# Patient Record
Sex: Female | Born: 1935 | Race: Black or African American | Hispanic: No | Marital: Married
Health system: Midwestern US, Community
[De-identification: ages and names within clinical notes are randomized; demographics above are authoritative.]

## PROBLEM LIST (undated history)

## (undated) DIAGNOSIS — IMO0001 Reserved for inherently not codable concepts without codable children: Secondary | ICD-10-CM

## (undated) DIAGNOSIS — I4891 Unspecified atrial fibrillation: Secondary | ICD-10-CM

## (undated) DIAGNOSIS — C541 Malignant neoplasm of endometrium: Secondary | ICD-10-CM

## (undated) DIAGNOSIS — M1990 Unspecified osteoarthritis, unspecified site: Secondary | ICD-10-CM

## (undated) DIAGNOSIS — IMO0002 Reserved for concepts with insufficient information to code with codable children: Secondary | ICD-10-CM

## (undated) DIAGNOSIS — E7800 Pure hypercholesterolemia, unspecified: Secondary | ICD-10-CM

## (undated) DIAGNOSIS — I639 Cerebral infarction, unspecified: Secondary | ICD-10-CM

## (undated) DIAGNOSIS — I10 Essential (primary) hypertension: Secondary | ICD-10-CM

## (undated) HISTORY — DX: Reserved for inherently not codable concepts without codable children: IMO0001

## (undated) HISTORY — DX: Reserved for concepts with insufficient information to code with codable children: IMO0002

## (undated) HISTORY — PX: DILATION AND CURETTAGE OF UTERUS: SHX78

---
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
## 2012-07-11 HISTORY — PX: COLONOSCOPY: SHX174

## 2014-07-16 HISTORY — PX: ROBOTIC ASSISTED TOTAL HYSTERECTOMY WITH BILATERAL SALPINGO OOPHERECTOMY: SHX6086

## 2014-07-16 LAB — BLOOD TYPE, (ABO+RH)
ABO/Rh(D): O POS
ABO/Rh: O POS

## 2014-07-16 LAB — BLOOD TYPE CONFIRM.: ABO/Rh(D): O POS

## 2014-07-16 LAB — ANTIBODY SCREEN: Antibody screen: NEGATIVE

## 2014-07-16 NOTE — Op Note (Signed)
[facility]  Operation [facility]  NAME:  Brittney Mcgrath, Brittney Mcgrath  SEX:   F  DATE: 07/16/2014  DOB: 09/03/1933  MR#    [id number]  ROOM:  [id number]  ACCT#  [id number]        DATE OF OPERATION:   07/16/2014    PREOPERATIVE DIAGNOSIS:  Complex hyperplasia with atypia.    POSTOPERATIVE DIAGNOSES:  Complex hyperplasia with atypia.    PRIMARY SURGEON:  Alona BeneStacey Elliemae Braman, M.D.    SURGICAL ASSISTANT:  Brittney Mcgrath    ANESTHESIA:  General endotracheal.    COMPLICATIONS:  None.    ESTIMATED BLOOD LOSS:  Minimal.    INDICATIONS:  The patient is a 80 year old woman who was referred secondary to findings of  complex hyperplasia with atypia after being worked up for postmenopausal  bleeding.  She has multiple medical problems including atrial fibrillation,  prior stroke and hypertension.  She was counseled and surgery was recommended.    FINDINGS:  On examination under anesthesia showed a normal cervix.  On diagnostic  laparoscopy to have a normal uterus, tubes and ovaries.  She had minimal  adhesions of her omentum to the left upper anterior abdominal wall.  All  peritoneal surfaces were glistening.    DESCRIPTION OF PROCEDURE:  After the patient was identified, she was taken to the operating room and  placed under general anesthesia.  She was prepped and draped in the usual  sterile fashion.  A Foley catheter was placed in position.  She was placed in  the dorsal lithotomy position and her arms were secured by her sides.  A  time-out was performed to correctly identify the patient, procedure,  allergies, and safety precautions.  A 10 mm incision was made at the umbilicus  and a Veress needle was introduced.  Pneumoperitoneum was achieved with 3  liters of CO2.  The Optiview trocar was then placed under direct  visualization.  Two lateral ports 3.5 was placed on the left, 1 was placed on  the right as well as long 5 mm assistant port.  The patient was placed in  Trendelenburg position and the [employer] system was then docked into  place.  Attention was then turned to the pelvis were it was inspected.  The  retroperitoneal spaces were developed after dividing the round ligaments  bilaterally.  The perirectal and perivesical spaces were developed.  The  ureter was noted in anatomic position.  The infundibulopelvic ligament was  then isolated from the ureters.  The infundibulopelvic ligaments were then  clamped, sealed and divided.  The uterine arteries were skeletonized and the  vesicouterine peritoneum was then dissected to level of the vagina.  The  uterine arteries were then skeletonized, clamped, sealed and divided.  The  cardinal ligaments were then clamped, sealed and divided.  Electrosurgical  scissors were then utilized to separate the cervix from the vagina.  The  uterus was then delivered to the field.  The vagina was then closed using a  running mass closure of 2-0 PDS and barbed suture.  This was passed on itself  secure the suture.  The patient's pelvis and abdomen were then copiously  irrigated with warm normal saline and eventually the needle was delivered.  The Carter-Thomason system was then used to close the fascia and peritoneum.  All trocars were removed from patient's abdomen and pelvis.  The skin was  injected with 20 mL of 0.25% Marcaine.  The skin was closed with 4-0 Monocryl.  The patient will be  awakened from anesthesia and taken to the recovery room in  stable condition.      ___________________  Michail SermonStacey J Talyah Seder MD  Dictated By:.   BB  D:07/16/2014 13:45:52  T: 07/16/2014 14:38:18  [id number]  Electronically Authenticated and Edited by:  Michail SermonSTACEY J. Taci Sterling, M.D. On 07/23/2014 09:02 AM EST

## 2014-07-16 NOTE — Op Note (Addendum)
[facility]  Operation Report  NAME:  Brittney Mcgrath, Brittney Mcgrath  SEX:   F  DATE: 07/16/2014  DOB: 12-19-1933  MR#    [id number]  ROOM:  [id number]  ACCT#  [id number]        DATE OF OPERATION:   07/16/2014    PREOPERATIVE DIAGNOSIS:  Complex hyperplasia with atypia.    POSTOPERATIVE DIAGNOSES:  Complex hyperplasia with atypia.    PRIMARY SURGEON:  Alona BeneStacey Atara Paterson, M.D.    SURGICAL ASSISTANT:  Chauncy LeanRandy Henderson    ANESTHESIA:  General endotracheal.    COMPLICATIONS:  None.    ESTIMATED BLOOD LOSS:  Minimal.    INDICATIONS:  The patient is a 80 year old woman who was referred secondary to findings of  complex hyperplasia with atypia after being worked up for postmenopausal  bleeding.  She has multiple medical problems including atrial fibrillation,  prior stroke and hypertension.  She was counseled and surgery was recommended.    FINDINGS:  On examination under anesthesia showed a normal cervix.  On diagnostic  laparoscopy to have a normal uterus, tubes and ovaries.  She had minimal  adhesions of her omentum to the left upper anterior abdominal wall.  All  peritoneal surfaces were glistening.    DESCRIPTION OF PROCEDURE:  After the patient was identified, she was taken to the operating room and  placed under general anesthesia.  She was prepped and draped in the usual  sterile fashion.  A Foley catheter was placed in position.  She was placed in  the dorsal lithotomy position and her arms were secured by her sides.  A  time-out was performed to correctly identify the patient, procedure,  allergies, and safety precautions.  A 10 mm incision was made at the umbilicus  and a Veress needle was introduced.  Pneumoperitoneum was achieved with 3  liters of CO2.  The Optiview trocar was then placed under direct  visualization.  Two lateral ports 3.5 was placed on the left, 1 was placed on  the right as well as long 5 mm assistant port.  The patient was placed in   Trendelenburg position and the [employer] system was then docked into place.  Attention was then turned to the pelvis were it was inspected.  The  retroperitoneal spaces were developed after dividing the round ligaments  bilaterally.  The perirectal and perivesical spaces were developed.  The  ureter was noted in anatomic position.  The infundibulopelvic ligament was  then isolated from the ureters.  The infundibulopelvic ligaments were then  clamped, sealed and divided.  The uterine arteries were skeletonized and the  vesicouterine peritoneum was then dissected to level of the vagina.  The  uterine arteries were then skeletonized, clamped, sealed and divided.  The  cardinal ligaments were then clamped, sealed and divided.  Electrosurgical  scissors were then utilized to separate the cervix from the vagina.  The  uterus was then delivered to the field.  The vagina was then closed using a  running mass closure of 2-0 PDS and barbed suture.  This was passed on itself  secure the suture.  The patient's pelvis and abdomen were then copiously  irrigated with warm normal saline and eventually the needle was delivered.  The Carter-Thomason system was then used to close the fascia and peritoneum.  All trocars were removed from patient's abdomen and pelvis.  The skin was  injected with 20 mL of 0.25% Marcaine.  The skin was closed with 4-0 Monocryl.  The patient will be  awakened from anesthesia and taken to the recovery room in  stable condition.      ___________________  Brittney SermonStacey J Aicia Babinski MD  Dictated By:.   BB  D:07/16/2014 13:45:52  T: 07/16/2014 14:38:18  [id number]  Electronically Authenticated and Edited by:  Brittney SermonSTACEY J. [name], M.D. On 07/23/2014 09:02 AM EST

## 2014-07-17 LAB — CBC W/O DIFF
HCT: 36.2 % — ABNORMAL LOW (ref 37.0–50.0)
HGB: 11.8 gm/dl — ABNORMAL LOW (ref 13.0–17.2)
MCH: 31.4 pg (ref 25.4–34.6)
MCHC: 32.6 gm/dl (ref 30.0–36.0)
MCV: 96.3 fL (ref 80.0–98.0)
MPV: 12.2 fL — ABNORMAL HIGH (ref 6.0–10.0)
PLATELET: 189 10*3/uL (ref 140–450)
RBC: 3.76 M/uL (ref 3.60–5.20)
RDW-SD: 46 (ref 36.4–46.3)
WBC: 15.2 10*3/uL — ABNORMAL HIGH (ref 4.0–11.0)

## 2014-07-17 LAB — METABOLIC PANEL, BASIC
BUN: 33 mg/dl — ABNORMAL HIGH (ref 7–25)
CO2: 23 mEq/L (ref 21–32)
Calcium: 9.1 mg/dl (ref 8.5–10.1)
Chloride: 107 mEq/L (ref 98–107)
Creatinine: 1.9 mg/dl — ABNORMAL HIGH (ref 0.6–1.3)
GFR est AA: 33
GFR est non-AA: 27
Glucose: 165 mg/dl — ABNORMAL HIGH (ref 74–106)
Potassium: 4.2 mEq/L (ref 3.5–5.1)
Sodium: 141 mEq/L (ref 136–145)

## 2014-09-02 ENCOUNTER — Encounter: Payer: Self-pay | Admitting: Radiation Oncology

## 2014-09-02 NOTE — Progress Notes (Signed)
GYN Location of Tumor / Histology: Endometrial cancer  Natasha Schneider presented with about a year history of postmenopausal bleeding  Biopsies revealed:   07/16/2014   Past/Anticipated interventions by Gyn/Onc surgery, if any: 07/16/14 - hysterectomy-bil salpingo-oophorectomy  Past/Anticipated interventions by medical oncology, if any: no  Weight changes, if any: has lost 4 lbs in last week.  She reports her appetite is OK.   Bowel/Bladder complaints, if any: denies bladder issues,, she reports having constipation - she does take Miralax occasionally.  Nausea/Vomiting, if any: no  Pain issues, if any:  no  SAFETY ISSUES:  Prior radiation? no  Pacemaker/ICD? no  Possible current pregnancy? no  Is the patient on methotrexate? no  Current Complaints / other details:  Patient is here in a wheelchair with her son.  She has 3 children.  She has some weakness in her right side from a stroke in 2014.

## 2014-09-04 ENCOUNTER — Encounter: Payer: Self-pay | Admitting: Radiation Oncology

## 2014-09-04 ENCOUNTER — Ambulatory Visit
Admission: RE | Admit: 2014-09-04 | Discharge: 2014-09-04 | Disposition: A | Discharge status: Home / Self Care | Payer: Medicare Other | Source: Ambulatory Visit | Attending: Radiation Oncology | Admitting: Radiation Oncology

## 2014-09-04 ENCOUNTER — Telehealth: Payer: Self-pay | Admitting: *Deleted

## 2014-09-04 VITALS — BP 111/40 | HR 70 | Temp 97.6°F | Resp 16 | Ht 66.0 in | Wt 248.3 lb

## 2014-09-04 DIAGNOSIS — C541 Malignant neoplasm of endometrium: Secondary | ICD-10-CM

## 2014-09-04 DIAGNOSIS — Z8673 Personal history of transient ischemic attack (TIA), and cerebral infarction without residual deficits: Secondary | ICD-10-CM | POA: Diagnosis not present

## 2014-09-04 DIAGNOSIS — Z510 Encounter for antineoplastic radiation therapy: Secondary | ICD-10-CM | POA: Diagnosis present

## 2014-09-04 DIAGNOSIS — E780 Pure hypercholesterolemia: Secondary | ICD-10-CM | POA: Insufficient documentation

## 2014-09-04 DIAGNOSIS — Z7901 Long term (current) use of anticoagulants: Secondary | ICD-10-CM | POA: Insufficient documentation

## 2014-09-04 DIAGNOSIS — Z90722 Acquired absence of ovaries, bilateral: Secondary | ICD-10-CM | POA: Diagnosis not present

## 2014-09-04 DIAGNOSIS — I4891 Unspecified atrial fibrillation: Secondary | ICD-10-CM | POA: Diagnosis not present

## 2014-09-04 DIAGNOSIS — I10 Essential (primary) hypertension: Secondary | ICD-10-CM | POA: Diagnosis not present

## 2014-09-04 DIAGNOSIS — Z90710 Acquired absence of both cervix and uterus: Secondary | ICD-10-CM | POA: Insufficient documentation

## 2014-09-04 HISTORY — DX: Cerebral infarction, unspecified: I63.9

## 2014-09-04 HISTORY — DX: Pure hypercholesterolemia, unspecified: E78.00

## 2014-09-04 HISTORY — DX: Unspecified osteoarthritis, unspecified site: M19.90

## 2014-09-04 HISTORY — DX: Unspecified atrial fibrillation: I48.91

## 2014-09-04 HISTORY — DX: Essential (primary) hypertension: I10

## 2014-09-04 HISTORY — DX: Malignant neoplasm of endometrium: C54.1

## 2014-09-04 LAB — COMPREHENSIVE METABOLIC PANEL (CC13)
ALBUMIN: 3.8 g/dL (ref 3.5–5.0)
ALT: 22 U/L (ref 0–55)
AST: 23 U/L (ref 5–34)
Alkaline Phosphatase: 43 U/L (ref 40–150)
Anion Gap: 9 mEq/L (ref 3–11)
BUN: 39.6 mg/dL — ABNORMAL HIGH (ref 7.0–26.0)
CALCIUM: 10 mg/dL (ref 8.4–10.4)
CO2: 27 mEq/L (ref 22–29)
Chloride: 107 mEq/L (ref 98–109)
Creatinine: 2 mg/dL — ABNORMAL HIGH (ref 0.6–1.1)
EGFR: 27 mL/min/{1.73_m2} — ABNORMAL LOW (ref >90)
Glucose: 111 mg/dl (ref 70–140)
Potassium: 4.3 mEq/L (ref 3.5–5.1)
SODIUM: 144 meq/L (ref 136–145)
Total Bilirubin: 0.55 mg/dL (ref 0.20–1.20)
Total Protein: 6.9 g/dL (ref 6.4–8.3)

## 2014-09-04 LAB — CBC WITH DIFFERENTIAL/PLATELET
BASO%: 0.9 % (ref 0.0–2.0)
Basophils Absolute: 0.1 10*3/uL (ref 0.0–0.1)
EOS%: 0.2 % (ref 0.0–7.0)
Eosinophils Absolute: 0 10*3/uL (ref 0.0–0.5)
HEMATOCRIT: 34.2 % — AB (ref 34.8–46.6)
HGB: 11 g/dL — ABNORMAL LOW (ref 11.6–15.9)
LYMPH%: 15.7 % (ref 14.0–49.7)
MCH: 30.2 pg (ref 25.1–34.0)
MCHC: 32 g/dL (ref 31.5–36.0)
MCV: 94.2 fL (ref 79.5–101.0)
MONO#: 0.6 10*3/uL (ref 0.1–0.9)
MONO%: 9.9 % (ref 0.0–14.0)
NEUT#: 4.8 10*3/uL (ref 1.5–6.5)
NEUT%: 73.3 % (ref 38.4–76.8)
PLATELETS: 200 10*3/uL (ref 145–400)
RBC: 3.63 10*6/uL — AB (ref 3.70–5.45)
RDW: 13.8 % (ref 11.2–14.5)
WBC: 6.5 10*3/uL (ref 3.9–10.3)
lymph#: 1 10*3/uL (ref 0.9–3.3)

## 2014-09-04 NOTE — Progress Notes (Signed)
Radiation Oncology         (336) [phone] ________________________________  Initial Outpatient Consultation  Name: Natasha Schneider MRN: [id number]  Date: 09/04/2014  DOB: 10-26-1933  CC:No primary care provider on file.  No ref. provider found   REFERRING PHYSICIAN: Clance Boll, MD  DIAGNOSIS: Stage T1b, NX, MX, FIGO grade I endometrial cancer  HISTORY OF PRESENT ILLNESS::Natasha Schneider is a 81 y.o. female who is seen out of the courtesy of Dr. Jerelene Redden for an opinion concerning radiation therapy as part of the management of the patient's recently diagnosed stage I endometrial cancer. Late last year the patient presented with postmenopausal vaginal bleeding. She underwent workup and  was found to have complex hyperplasia with atypia. On November 17 patient proceeded to undergo hysterectomy and bilateral salpingo-oophorectomy. Patient did not have nodal evaluation at that time. Pathology from the patient's surgery revealed endometrioid adenocarcinoma with overall FIGO grade 1. The tumor invaded more than half of the thickness of the myometrium (1.5 cm out of 1.8 cm). Surgery was performed with robotic assistance. She has done well since her surgery. The patient was seen in consultation at the [facility] where the patient lives in [place]. Per NCCN  guidelines it was recommended patient be considered for vaginal brachytherapy. This form of radiation therapy is not available at this facility. The patient's son lives here in [place] and subsequent arrangements for vaginal brachytherapy have been requested with [facility]Marland Kitchen  PREVIOUS RADIATION THERAPY: No  PAST MEDICAL HISTORY:  has a past medical history of Endometrial cancer; Atrial fibrillation; Stroke; Hypercholesteremia; Hypertension; and Arthritis.    PAST SURGICAL HISTORY: Past Surgical History  Procedure Laterality Date  . Robotic assisted total hysterectomy with bilateral salpingo oopherectomy  07/16/14  . Colonoscopy   07/11/12  . Dilation and curettage of uterus  1990, 2005, 2015  . Tonsilectomy/adenoidectomy with myringotomy  1945    FAMILY HISTORY: family history includes Colon cancer in her father.  SOCIAL HISTORY:  reports that she has never smoked. She does not have any smokeless tobacco history on file. She reports that she does not drink alcohol or use illicit drugs.  ALLERGIES: Review of patient's allergies indicates no known allergies.  MEDICATIONS:  Current Outpatient Prescriptions  Medication Sig Dispense Refill  . alendronate (FOSAMAX) 70 MG tablet Take 70 mg by mouth once a week. Take with a full glass of water on an empty stomach.    Marland Kitchen amiodarone (PACERONE) 200 MG tablet Take 200 mg by mouth daily.    . Cholecalciferol (VITAMIN D-3 PO) Take by mouth.    Marland Kitchen lisinopril (PRINIVIL,ZESTRIL) 5 MG tablet Take 12.5 mg by mouth daily.     . metoprolol succinate (TOPROL-XL) 25 MG 24 hr tablet Take 50 mg by mouth daily.     Marland Kitchen NIFEdipine (PROCARDIA XL/ADALAT-CC) 60 MG 24 hr tablet Take 60 mg by mouth daily.    . rivaroxaban (XARELTO) 10 MG TABS tablet Take 10 mg by mouth daily.    . rosuvastatin (CRESTOR) 20 MG tablet Take 20 mg by mouth daily.     No current facility-administered medications for this encounter.    REVIEW OF SYSTEMS:  A 15 point review of systems is documented in the electronic medical record. This was obtained by the nursing staff. However, I reviewed this with the patient to discuss relevant findings and make appropriate changes.  The patient denies any pelvic pain vaginal bleeding or discharge since her surgery. She denies any urination difficulties or bowel complaints.  PHYSICAL EXAM:  height is 5\' 6"  (1.676 m) and weight is 248 lb 4.8 oz (112.628 kg). Her oral temperature is 97.6 F (36.4 C). Her blood pressure is 111/40 and her pulse is 70. Her respiration is 16.  this is a very pleasant 81 year old female sitting in wheelchair. She is accompanied by her son on evaluation  today. Patient is alert and responds appropriately to questions. The pupils are equal round and reactive to light. Extraocular eye movements are intact. The tongue is midline. No secondary infection noted in the oral cavity or posterior pharynx. The teeth are in good repair with some teeth missing posteriorly. No mucosal lesions noted. Examination of the neck and supraclavicular region reveals no evidence of adenopathy. The axillary areas are free of adenopathy. Examination of the lungs reveals them to be clear. The heart has a regular rhythm and rate. The abdomen is soft and nontender with normal bowel sounds. On neurological examination motor strength is 5 out of 5 in the proximal and distal muscle groups of the upper lower extremities. Peripheral pulses are good. No cyanosis. Some edema in the ankle and foot areas. Pelvic exam is deferred until simulation and planning day. The patient ambulates with the assistance of a cane.    ECOG = 1    1 - Symptomatic but completely ambulatory (Restricted in physically strenuous activity but ambulatory and able to carry out work of a light or sedentary nature. For example, light housework, office work)  LABORATORY DATA:  Lab Results  Component Value Date   WBC 6.5 09/04/2014   HGB 11.0* 09/04/2014   HCT 34.2* 09/04/2014   MCV 94.2 09/04/2014   PLT 200 09/04/2014   NEUTROABS 4.8 09/04/2014   Lab Results  Component Value Date   NA 144 09/04/2014   K 4.3 09/04/2014   CO2 27 09/04/2014   GLUCOSE 111 09/04/2014   CREATININE 2.0* 09/04/2014   CALCIUM 10.0 09/04/2014      RADIOGRAPHY: CT scan of the abdomen and pelvis pending    IMPRESSION: Stage T1b, NX, MX, FIGO grade I endometrial cancer. The patient has not had formal evaluation of her nodal status and I would recommend evaluation of this issue. We briefly discussed surgical evaluation.  the patient is not a good candidate for this and does not wish to proceed with this evaluation. I would  recommend a CT scan of the abdomen and pelvis to rule out lymph node metastasis. If there is no sign of metastatic spread,  the patient will be a good candidate for intracavitary brachytherapy treatments using a high-dose-rate applicator with iridium 192. Today I discussed this treatment course side effects and potential toxicities. The patient appears to understand and wishes to proceed with this treatment pending CT scan results.  PLAN: CT scan of abdomen and pelvis followed by intracavitary brachytherapy treatments   I spent 60 minutes minutes face to face with the patient and her son and more than 50% of that time was spent in counseling and/or coordination of care.   ------------------------------------------------  Blair Promise, PhD, MD

## 2014-09-04 NOTE — Progress Notes (Signed)
Please see the Nurse Progress Note in the MD Initial Consult Encounter for this patient. 

## 2014-09-04 NOTE — Telephone Encounter (Signed)
Called patient's son- Jonna Clark to inform of test, lvm for a return call

## 2014-09-07 ENCOUNTER — Other Ambulatory Visit: Payer: Self-pay | Admitting: Radiation Oncology

## 2014-09-07 DIAGNOSIS — C541 Malignant neoplasm of endometrium: Secondary | ICD-10-CM

## 2014-09-09 ENCOUNTER — Ambulatory Visit (HOSPITAL_COMMUNITY)
Admission: RE | Admit: 2014-09-09 | Discharge: 2014-09-09 | Disposition: A | Discharge status: Home / Self Care | Payer: Medicare Other | Source: Ambulatory Visit | Attending: Radiation Oncology | Admitting: Radiation Oncology

## 2014-09-09 ENCOUNTER — Encounter (HOSPITAL_COMMUNITY): Payer: Self-pay

## 2014-09-09 DIAGNOSIS — C55 Malignant neoplasm of uterus, part unspecified: Secondary | ICD-10-CM | POA: Diagnosis not present

## 2014-09-09 DIAGNOSIS — Z90710 Acquired absence of both cervix and uterus: Secondary | ICD-10-CM | POA: Diagnosis not present

## 2014-09-09 DIAGNOSIS — K449 Diaphragmatic hernia without obstruction or gangrene: Secondary | ICD-10-CM | POA: Insufficient documentation

## 2014-09-09 DIAGNOSIS — M479 Spondylosis, unspecified: Secondary | ICD-10-CM | POA: Insufficient documentation

## 2014-09-09 DIAGNOSIS — C541 Malignant neoplasm of endometrium: Secondary | ICD-10-CM

## 2014-09-09 DIAGNOSIS — K5900 Constipation, unspecified: Secondary | ICD-10-CM | POA: Insufficient documentation

## 2014-09-09 DIAGNOSIS — R932 Abnormal findings on diagnostic imaging of liver and biliary tract: Secondary | ICD-10-CM | POA: Diagnosis not present

## 2014-09-11 ENCOUNTER — Telehealth: Payer: Self-pay | Admitting: Oncology

## 2014-09-11 NOTE — Telephone Encounter (Signed)
Clayman left a message asking for [place]'s CT scan results from 09/09/14.

## 2014-09-11 NOTE — Telephone Encounter (Signed)
Natasha Schneider that Call[facility]'s CT scan results were good per Dr. Sondra Come.  He verbalized understanding and did not have any further questions.

## 2014-09-12 ENCOUNTER — Telehealth: Payer: Self-pay | Admitting: *Deleted

## 2014-09-12 NOTE — Telephone Encounter (Signed)
Called patient's son - Amilya Haver, to inform of mother's HDR Case, spoke with patient's son and he is aware of these appts.

## 2014-09-13 ENCOUNTER — Telehealth: Payer: Self-pay | Admitting: *Deleted

## 2014-09-13 NOTE — Telephone Encounter (Signed)
CALLED PATIENT'S SON CLAYMAN Tocco TO MAKE AWARE THAT TREATMENT APPT. ON 09-17-14 HAS BEEN MOVED TO 2 PM THAT DAY, SPOKE WITH PATIENT'S SON AND HE IS AWARE OF THIS APPT. CHANGE AND IS IT AGREEABLE WITH IT.

## 2014-09-16 ENCOUNTER — Telehealth: Payer: Self-pay | Admitting: *Deleted

## 2014-09-16 NOTE — Telephone Encounter (Signed)
CALLED PATIENT'S SON- CLAYMON Azbill TO REMIND OF HDR APPTS. FOR 09-17-14, SPOKE WITH PATIENT'S SON AND HE IS AWARE OF THESE APPTS.

## 2014-09-17 ENCOUNTER — Ambulatory Visit
Admission: RE | Admit: 2014-09-17 | Discharge: 2014-09-17 | Disposition: A | Discharge status: Home / Self Care | Payer: Medicare Other | Source: Ambulatory Visit | Attending: Radiation Oncology | Admitting: Radiation Oncology

## 2014-09-17 ENCOUNTER — Encounter: Payer: Self-pay | Admitting: Radiation Oncology

## 2014-09-17 VITALS — BP 110/47 | HR 62 | Temp 98.7°F | Resp 16 | Ht 66.0 in | Wt 245.7 lb

## 2014-09-17 DIAGNOSIS — Z510 Encounter for antineoplastic radiation therapy: Secondary | ICD-10-CM | POA: Diagnosis not present

## 2014-09-17 DIAGNOSIS — C541 Malignant neoplasm of endometrium: Secondary | ICD-10-CM

## 2014-09-17 NOTE — Progress Notes (Signed)
  Radiation Oncology         [phone]) [phone] ________________________________  Name: Natasha Schneider MRN: [id number]  Date: 09/17/2014  DOB: 1934-02-04  SIMULATION AND TREATMENT PLANNING NOTE HDR BRACHYTHERAPY  DIAGNOSIS Stage T1b, N0, M0, FIGO grade I endometrial cancer    NARRATIVE:  The patient was brought to the [facility] suite.  Identity was confirmed.  All relevant records and images related to the planned course of therapy were reviewed.  The patient freely provided informed written consent to proceed with treatment after reviewing the details related to the planned course of therapy. The consent form was witnessed and verified by the simulation staff.  Then, the patient was set-up in a stable reproducible  supine position for radiation therapy.  the patient's custom vaginal cylinder was placed in the proximal vagina. tHe vaginal cylinder was affixed to the CT/MR stabilization plate to prevent slippage CT images were obtained.  Surface markings were placed.  The CT images were loaded into the planning software.  Then the target and avoidance structures were contoured.  Treatment planning then occurred.  The radiation prescription was entered and confirmed.   I have requested : Brachytherapy Isodose Plan and Dosimetry Calculations to plan the radiation distribution.    PLAN:  The patient will receive 30 Gy in 5 fractions using iridium 192 as the high-dose-rate source. The patient will be treated with a 3 cm diameter cylinder with a treatment length of 3 cm. Prescription will be to the vaginal mucosal surface.    ________________________________  Blair Promise, PhD, MD

## 2014-09-17 NOTE — Progress Notes (Signed)
Radiation Oncology         (336) [phone] ________________________________  Name: Natasha Schneider MRN: [id number]  Date: 09/17/2014  DOB: 11-Sep-1933  Vaginal brachytherapy Note  CC: No primary care provider on file.  No ref. provider found    ICD-9-CM ICD-10-CM   1. Endometrial cancer, grade I 182.0 C54.1     Diagnosis:   Stage T1b, N0, M0, FIGO grade I endometrial cancer    Narrative:  The patient returns today for planning and her first high-dose-rate radiation treatment directed at the proximal vagina. Since the patient's initial consultation January 6 patient did proceed with a CT scan of abdomen and pelvis. This showed no evidence of lymph node metastasis or other spread. Bloodwork the patient was noted to have some renal insufficiency. I discussed this with the patient's son today. He says this is not a new finding and is being followed by her primary care physician. Patient denies any vaginal bleeding since her initial evaluation or pelvic pain.        The patient was taken to the nursing suite and placed in the dorsolithotomy position. A pelvic exam was performed. The vaginal cuff was noted to be intact. Sutures were palpable in the proximal vagina. No mucosal lesions were noted in the vaginal vault. On bimanual examination there no pelvic masses appreciated.  The patient proceeded to undergo fitting for her custom vaginal cylinder. The optimal diameter to distend the vaginal vault without undue discomfort was a 3 cm diameter cylinder. Patient tolerated the procedure well. sHe will be transported to the CT simulation suite for planning. Treatment later today.                       Physical Findings: The patient is in no acute distress. Patient is alert and oriented.  height is 5\' 6"  (1.676 m) and weight is 245 lb 11.2 oz (111.449 kg). Her oral temperature is 98.7 F (37.1 C). Her blood pressure is 110/47 and her pulse is 62. Her respiration is 16. .  No significant changes.  Lab  Findings: Lab Results  Component Value Date   WBC 6.5 09/04/2014   HGB 11.0* 09/04/2014   HCT 34.2* 09/04/2014   MCV 94.2 09/04/2014   PLT 200 09/04/2014    Radiographic Findings: Ct Abdomen Pelvis Wo Contrast  09/09/2014   CLINICAL DATA:  Endometrial/uterine cancer diagnosed last month status post hysterectomy. Constipation. Initial encounter.  EXAM: CT ABDOMEN AND PELVIS WITHOUT CONTRAST  TECHNIQUE: Multidetector CT imaging of the abdomen and pelvis was performed following the standard protocol without IV contrast.  COMPARISON:  None.  FINDINGS: Lower chest: Clear lung bases. No significant pleural or pericardial effusion.There is a moderate size hiatal hernia.  Hepatobiliary: As evaluated in the noncontrast state, the liver demonstrates no focal abnormality. There is high-density material within the gallbladder fundus which likely represents small stones. There is no gallbladder wall thickening or biliary dilatation.  Pancreas: Unremarkable. No pancreatic ductal dilatation or surrounding inflammatory changes.  Spleen: Normal in size without focal abnormality.  Adrenals/Urinary Tract: Both adrenal glands appear normal.The kidneys appear normal without evidence of urinary tract calculus or hydronephrosis. No bladder abnormalities are seen.  Stomach/Bowel: No evidence of bowel wall thickening, distention or surrounding inflammatory change.No ascites or focal extraluminal fluid collection.  Vascular/Lymphatic: No retroperitoneal adenopathy demonstrated. Small pelvic lymph nodes along the iliac vessels are difficult to exclude on this noncontrast study. These are seen on images 54-56 and could be related to residual  ovarian tissue if the hysterectomy was partial. Mild aortoiliac atherosclerosis and tortuosity noted.  Reproductive: Status post hysterectomy.  No evidence of pelvic mass.  Other: No evidence of abdominal wall mass or hernia.  Musculoskeletal: No acute or significant osseous findings. There are  degenerative changes throughout the spine which are advanced inferiorly where the patient has multilevel grade 1 anterolisthesis and disc degeneration.  IMPRESSION: 1. Status post hysterectomy. No definite signs of metastatic disease on non contrast imaging. Small external iliac lymph nodes bilaterally are difficult to exclude. 2. The solid visceral organs appear unremarkable as imaged in the non contrast state. 3. Probable cholelithiasis. 4. Moderate size hiatal hernia. 5. Diffuse lumbar spondylosis.   Electronically Signed   By: Camie Patience M.D.   On: 09/09/2014 16:44    Impression:  Successful vaginal brachytherapy procedure  Plan: Transfer to the CT simulation for planning with treatment later today. ____________________________________ Blair Promise, MD

## 2014-09-17 NOTE — Progress Notes (Signed)
  Radiation Oncology         [phone] ________________________________  Name: Natasha Schneider MRN: [id number]  Date: 09/17/2014  DOB: 12/14/1933  Simple treatment device note  HDR BRACHYTHERAPY  DIAGNOSIS:  Stage T1b, N0, M0, FIGO grade I endometrial cancer  NARRATIVE: The patient had construction of her custom vaginal cylinder. She will be treated with three 3 cm rings. More distally within the vaginal vault with two 2.5 cm ring.   ________________________________  Blair Promise, PhD, MD

## 2014-09-17 NOTE — Progress Notes (Signed)
Natasha Schneider here for follow up.  She denies pain and bladder issues.  She reports constipation and tries to drink prune juice.  She denies vaginal bleeding.  BP 110/47 mmHg  Pulse 62  Temp(Src) 98.7 F (37.1 C) (Oral)  Resp 16  Ht 5\' 6"  (1.676 m)  Wt 245 lb 11.2 oz (111.449 kg)  BMI 39.68 kg/m2

## 2014-09-17 NOTE — Progress Notes (Signed)
  Radiation Oncology         [phone] ________________________________  Name: Natasha Schneider MRN: [id number]  Date: 09/17/2014  DOB: March 27, 1934   HDR BRACHYTHERAPY  DIAGNOSIS: Stage T1b, N0, M0, FIGO grade I endometrial cancer    NARRATIVE: The patient was taken to the high-dose-rate suite and placed in the dorsolithotomy position. The patient's custom vaginal cylinder was placed in the proximal vagina. This was affixed to the CT/MR stabilization plate to prevent slippage. A fiducial marker was placed within the vaginal cylinder.  Verification simulation  The patient underwent an AP and lateral film through the pelvis area. This documented accurate position of the vaginal cylinder for treatment  High-dose-rate brachytherapy procedure  The vaginal cylinder was affixed to the remote afterloading device by catheter system. The patient then proceeded to undergo her first high-dose-rate treatment directed at the proximal vagina. The patient was prescribed a dose of 6 Gy to be delivered to the proximal vaginal mucosal surface. The patient was treated with 1 channel using 7 dwell positions total treatment time 210.3 seconds. The patient tolerated the procedure well. After completion of her treatment a radiation survey was performed documenting return of the iridium source into the gamma med safe.    ________________________________  Blair Promise, PhD, MD

## 2014-09-18 ENCOUNTER — Telehealth: Payer: Self-pay | Admitting: *Deleted

## 2014-09-18 NOTE — Telephone Encounter (Signed)
CALLED PATIENT'S SON- CLAYMAN Allinson TO REMIND HIM OF HIS MOM'S HDR [place]. FOR 09-19-14,  LVM FOR A RETURN CALL

## 2014-09-19 ENCOUNTER — Ambulatory Visit
Admission: RE | Admit: 2014-09-19 | Discharge: 2014-09-19 | Disposition: A | Discharge status: Home / Self Care | Payer: Medicare Other | Source: Ambulatory Visit | Attending: Radiation Oncology | Admitting: Radiation Oncology

## 2014-09-19 DIAGNOSIS — C541 Malignant neoplasm of endometrium: Secondary | ICD-10-CM

## 2014-09-19 DIAGNOSIS — Z510 Encounter for antineoplastic radiation therapy: Secondary | ICD-10-CM | POA: Diagnosis not present

## 2014-09-19 NOTE — Progress Notes (Signed)
    Radiation Oncology [phone] ________________________________  Name: Natasha NorfleetMRN: [id number] Date: 1/21/2016DOB: Feb 09, 1934     HDR BRACHYTHERAPY  Vaginal brachytherapy Note  CC: No primary care provider on file. No ref. provider found    ICD-9-CM ICD-10-CM   1. Endometrial cancer, grade I 182.0 C54.1     Diagnosis: Stage T1b, N0, M0, FIGO grade I endometrial cancer    Narrative: The patient returns today for planning and her second high-dose-rate radiation treatment directed at the proximal vagina.    The patient was  placed in the dorsolithotomy position on the HDR treatment table. A pelvic exam was performed. The vaginal cuff was noted to be intact. Sutures were palpable in the proximal vagina. No mucosal lesions were noted in the vaginal vault. On bimanual examination there no pelvic masses appreciated.  The patient proceeded to undergo placement of her custom vaginal cylinder. The optimal diameter to distend the vaginal vault without undue discomfort was a 3 cm diameter cylinder. Patient tolerated the procedure well.   Simple treatment device note    NARRATIVE: The patient had construction of her custom vaginal cylinder. She will be treated with three 3 cm rings. More distally within the vaginal vault with two 2.5 cm ring.    NARRATIVE:  The patient's custom vaginal cylinder was placed in the proximal vagina. This was affixed to the CT/MR stabilization plate to prevent slippage. A fiducial marker was placed within the vaginal cylinder.  Verification simulation  The patient underwent an AP and lateral film through the pelvis area. This documented accurate position of the vaginal cylinder for treatment  High-dose-rate brachytherapy procedure  The vaginal cylinder was affixed to the remote afterloading device by catheter system. The patient then proceeded to undergo her second  high-dose-rate treatment directed at the proximal vagina. The patient was prescribed a dose of 6 Gy to be delivered to the proximal vaginal mucosal surface. The patient was treated with 1 channel using 7 dwell positions total treatment time 214.4 seconds. The patient tolerated the procedure well. After completion of her treatment a radiation survey was performed documenting return of the iridium source into the gamma med safe.  Blair Promise, MD

## 2014-09-23 ENCOUNTER — Telehealth: Payer: Self-pay | Admitting: *Deleted

## 2014-09-23 NOTE — Telephone Encounter (Signed)
Called patient to remind of HDR Tx. For 09-24-14 @ 9 am, spoke with patient's son, Jonna Clark and he is aware of this appt.

## 2014-09-24 ENCOUNTER — Ambulatory Visit
Admission: RE | Admit: 2014-09-24 | Discharge: 2014-09-24 | Disposition: A | Discharge status: Home / Self Care | Payer: Medicare Other | Source: Ambulatory Visit | Attending: Radiation Oncology | Admitting: Radiation Oncology

## 2014-09-24 ENCOUNTER — Encounter: Payer: Self-pay | Admitting: Radiation Oncology

## 2014-09-24 DIAGNOSIS — C541 Malignant neoplasm of endometrium: Secondary | ICD-10-CM

## 2014-09-24 DIAGNOSIS — Z510 Encounter for antineoplastic radiation therapy: Secondary | ICD-10-CM | POA: Diagnosis not present

## 2014-09-24 NOTE — Progress Notes (Signed)
    Radiation Oncology [phone] ________________________________  Name: Natasha NorfleetMRN: [id number] Date: 1/26/2016DOB: March 15, 1934     HDR BRACHYTHERAPY  Vaginal brachytherapy Note      ICD-9-CM ICD-10-CM   1. Endometrial cancer, grade I 182.0 C54.1     Diagnosis: Stage T1b, N0, M0, FIGO grade I endometrial cancer    Narrative: The patient returns today for planning and her third high-dose-rate radiation treatment directed at the proximal vagina.    The patient was placed in the dorsolithotomy position on the HDR treatment table. A pelvic exam was performed. The vaginal cuff was noted to be intact. Sutures were palpable in the proximal vagina. No mucosal lesions were noted in the vaginal vault. On bimanual examination there no pelvic masses appreciated.  The patient proceeded to undergo placement of her custom vaginal cylinder. The optimal diameter to distend the vaginal vault without undue discomfort was a 3 cm diameter cylinder. Patient tolerated the procedure well.   Simple treatment device note    NARRATIVE: The patient had construction of her custom vaginal cylinder. She will be treated with three 3 cm rings. More distally within the vaginal vault with two 2.5 cm ring.    NARRATIVE: The patient's custom vaginal cylinder was placed in the proximal vagina. This was affixed to the CT/MR stabilization plate to prevent slippage. A fiducial marker was placed within the vaginal cylinder.  Verification simulation  The patient underwent an AP and lateral film through the pelvis area. This documented accurate position of the vaginal cylinder for treatment  High-dose-rate brachytherapy procedure  The vaginal cylinder was affixed to the remote afterloading device by catheter system. The patient then proceeded to undergo her third high-dose-rate treatment directed at the proximal vagina. The  patient was prescribed a dose of 6 Gy to be delivered to the proximal vaginal mucosal surface. The patient was treated with 1 channel using 7 dwell positions total treatment time 224.6 seconds. The patient tolerated the procedure well. After completion of her treatment a radiation survey was performed documenting return of the iridium source into the gamma med safe.  Blair Promise, MD

## 2014-09-25 ENCOUNTER — Telehealth: Payer: Self-pay | Admitting: *Deleted

## 2014-09-25 NOTE — Telephone Encounter (Signed)
Called patient to remind of HDR Tx. For 09-26-14, spoke with son and he is aware of this appt.

## 2014-09-26 ENCOUNTER — Ambulatory Visit
Admission: RE | Admit: 2014-09-26 | Discharge: 2014-09-26 | Disposition: A | Discharge status: Home / Self Care | Payer: Medicare Other | Source: Ambulatory Visit | Attending: Radiation Oncology | Admitting: Radiation Oncology

## 2014-09-26 DIAGNOSIS — Z510 Encounter for antineoplastic radiation therapy: Secondary | ICD-10-CM | POA: Diagnosis not present

## 2014-09-26 DIAGNOSIS — C541 Malignant neoplasm of endometrium: Secondary | ICD-10-CM

## 2014-09-26 NOTE — Progress Notes (Signed)
    Radiation Oncology [phone] ________________________________  Name: Natasha NorfleetMRN: [id number] Date: 1/28/2016DOB: November 17, 1933     HDR BRACHYTHERAPY  Vaginal brachytherapy Note      ICD-9-CM ICD-10-CM   1. Endometrial cancer, grade I 182.0 C54.1     Diagnosis: Stage T1b, N0, M0, FIGO grade I endometrial cancer    Narrative: The patient returns today for planning and her fourth high-dose-rate radiation treatment directed at the proximal vagina.    The patient was placed in the dorsolithotomy position on the HDR treatment table. A pelvic exam was performed. The vaginal cuff was noted to be intact. Sutures were palpable in the proximal vagina. No mucosal lesions were noted in the vaginal vault. On bimanual examination there no pelvic masses appreciated.  The patient proceeded to undergo placement of her custom vaginal cylinder. The optimal diameter to distend the vaginal vault without undue discomfort was a 3 cm diameter cylinder. Patient tolerated the procedure well.   Simple treatment device note    NARRATIVE: The patient had construction of her custom vaginal cylinder. She will be treated with three 3 cm rings. More distally within the vaginal vault with two 2.5 cm ring.    NARRATIVE: The patient's custom vaginal cylinder was placed in the proximal vagina. This was affixed to the CT/MR stabilization plate to prevent slippage. A fiducial marker was placed within the vaginal cylinder.  Verification simulation  The patient underwent an AP and lateral film through the pelvis area. This documented accurate position of the vaginal cylinder for treatment  High-dose-rate brachytherapy procedure  The vaginal cylinder was affixed to the remote afterloading device by catheter system. The patient then proceeded to undergo her fourth high-dose-rate treatment directed at the proximal  vagina. The patient was prescribed a dose of 6 Gy to be delivered to the proximal vaginal mucosal surface. The patient was treated with 1 channel using 7 dwell positions total treatment time 228.9 seconds. The patient tolerated the procedure well. After completion of her treatment a radiation survey was performed documenting return of the iridium source into the gamma med safe.  Blair Promise, MD

## 2014-09-30 ENCOUNTER — Telehealth: Payer: Self-pay | Admitting: *Deleted

## 2014-09-30 NOTE — Telephone Encounter (Signed)
CALLED PATIENT'S SON- CLAYMAN Tawil TO REMIND HIM OF HIS MOM'S HDR [place] 10-01-14 @ 9 AM, SPOKE WITH PATIENT'S SON- CLAYMAN Rickels AND HE IS AWARE OF THIS APPT.

## 2014-10-01 ENCOUNTER — Ambulatory Visit
Admission: RE | Admit: 2014-10-01 | Discharge: 2014-10-01 | Disposition: A | Discharge status: Home / Self Care | Payer: Medicare Other | Source: Ambulatory Visit | Attending: Radiation Oncology | Admitting: Radiation Oncology

## 2014-10-01 ENCOUNTER — Encounter: Payer: Self-pay | Admitting: Radiation Oncology

## 2014-10-01 VITALS — BP 109/71 | HR 83 | Temp 98.0°F

## 2014-10-01 DIAGNOSIS — C541 Malignant neoplasm of endometrium: Secondary | ICD-10-CM

## 2014-10-01 DIAGNOSIS — Z510 Encounter for antineoplastic radiation therapy: Secondary | ICD-10-CM | POA: Diagnosis not present

## 2014-10-01 NOTE — Progress Notes (Signed)
    Radiation Oncology [phone] ________________________________  Name: Natasha NorfleetMRN: [id number] Date: 2/2/2016DOB: 06-10-1934     HDR BRACHYTHERAPY  Vaginal brachytherapy Note      ICD-9-CM ICD-10-CM   1. Endometrial cancer, grade I 182.0 C54.1     Diagnosis: Stage T1b, N0, M0, FIGO grade I endometrial cancer    Narrative: The patient returns today for  her fifth high-dose-rate radiation treatment directed at the proximal vagina.    The patient was placed in the dorsolithotomy position on the HDR treatment table. A pelvic exam was performed. The vaginal cuff was noted to be intact. Sutures were palpable in the proximal vagina. No mucosal lesions were noted in the vaginal vault. On bimanual examination there no pelvic masses appreciated.  The patient proceeded to undergo placement of her custom vaginal cylinder. The optimal diameter to distend the vaginal vault without undue discomfort was a 3 cm diameter cylinder. Patient tolerated the procedure well.   Simple treatment device note    NARRATIVE: The patient had construction of her custom vaginal cylinder. She will be treated with three 3 cm rings. More distally within the vaginal vault with two 2.5 cm ring.    NARRATIVE: The patient's custom vaginal cylinder was placed in the proximal vagina. This was affixed to the CT/MR stabilization plate to prevent slippage. A fiducial marker was placed within the vaginal cylinder.  Verification simulation  The patient underwent an AP and lateral film through the pelvis area. This documented accurate position of the vaginal cylinder for treatment  High-dose-rate brachytherapy procedure  The vaginal cylinder was affixed to the remote afterloading device by catheter system. The patient then proceeded to undergo her fifth high-dose-rate treatment directed at the proximal  vagina. The patient was prescribed a dose of 6 Gy to be delivered to the proximal vaginal mucosal surface. The patient was treated with 1 channel using 7 dwell positions total treatment time 239.9 seconds. The patient tolerated the procedure well. After completion of her treatment a radiation survey was performed documenting return of the iridium source into the gamma med safe.  Blair Promise, MD

## 2014-10-08 ENCOUNTER — Encounter: Payer: Self-pay | Admitting: Radiation Oncology

## 2014-10-15 ENCOUNTER — Encounter: Payer: Self-pay | Admitting: Radiation Oncology

## 2014-10-15 NOTE — Progress Notes (Signed)
  Radiation Oncology         [phone] ________________________________  Name: Natasha Schneider MRN: [id number]  Date: 10/15/2014  DOB: Oct 15, 1933  End of Treatment Note  Diagnosis:   Stage T1b, N0, M0, FIGO grade I endometrial cancer  Indication for treatment:  Postop, risk for vaginal cuff recurrence      Radiation treatment dates:   January 19, January 21, January 26, January 28, February 2  Site/dose:   Proximal vagina,  30 gray in 5 fractions  Beams/energy:   The patient was treated with high-dose rate afterloading techniques using iridium 192 as the high-dose-rate source. The patient was treated with a 3 cm diameter cylinder with a treatment length of 3 cm. Prescription was 6 gray to the vaginal mucosal surface for each treatment  Narrative: The patient tolerated radiation treatment relatively well.  She had minimal discomfort with placement of the vaginal cylinder. No nausea or bladder/rectal issues  Plan: The patient has completed radiation treatment. The patient will return to radiation oncology clinic for routine followup in one month. I advised them to call or return sooner if they have any questions or concerns related to their recovery or treatment.  -----------------------------------  Blair Promise, PhD, MD

## 2014-12-03 ENCOUNTER — Telehealth: Payer: Self-pay | Admitting: Oncology

## 2014-12-03 NOTE — Telephone Encounter (Signed)
[facility] and advised him that Royal needs to come for her follow up in May and then may follow up near [facility].  Clyde verbalized agreement.

## 2015-01-08 ENCOUNTER — Encounter: Payer: Self-pay | Admitting: Oncology

## 2015-01-09 ENCOUNTER — Encounter: Payer: Self-pay | Admitting: Radiation Oncology

## 2015-01-09 ENCOUNTER — Ambulatory Visit
Admission: RE | Admit: 2015-01-09 | Discharge: 2015-01-09 | Disposition: A | Discharge status: Home / Self Care | Payer: Medicare Other | Source: Ambulatory Visit | Attending: Radiation Oncology | Admitting: Radiation Oncology

## 2015-01-09 ENCOUNTER — Other Ambulatory Visit (HOSPITAL_COMMUNITY)
Admission: RE | Admit: 2015-01-09 | Discharge: 2015-01-09 | Disposition: A | Discharge status: Home / Self Care | Payer: Medicare Other | Source: Ambulatory Visit | Attending: Radiation Oncology | Admitting: Radiation Oncology

## 2015-01-09 VITALS — BP 105/76 | HR 73 | Temp 98.3°F | Resp 16 | Ht 66.0 in | Wt 228.7 lb

## 2015-01-09 DIAGNOSIS — Z01411 Encounter for gynecological examination (general) (routine) with abnormal findings: Secondary | ICD-10-CM | POA: Insufficient documentation

## 2015-01-09 DIAGNOSIS — C541 Malignant neoplasm of endometrium: Secondary | ICD-10-CM

## 2015-01-09 NOTE — Progress Notes (Signed)
Natasha Schneider here in a wheelchair for follow up.  She denies pain.  She reports that she has a few instances of urinating and then wiping and having a bowel movement instead.  She reports having constipation with her last bowel movement two days ago.  She reports that the bowel movements are small.  She was taking Miralax.  Advised her to start taking it again.  She denies vaginal bleeding and discharge.  She had a pacemaker put in on 2/17.  She has lost 17 lbs since January.  She reports having a poor appetite and fatigue.  She is using a vaginal dilator once or twice a week.  BP 105/76 mmHg  Pulse 73  Temp(Src) 98.3 F (36.8 C) (Oral)  Resp 16  Ht 5\' 6"  (1.676 m)  Wt 228 lb 11.2 oz (103.738 kg)  BMI 36.93 kg/m2  SpO2 100%

## 2015-01-09 NOTE — Progress Notes (Signed)
Radiation Oncology         [phone] ________________________________  Name: Natasha Schneider MRN: [id number]  Date: 01/09/2015  DOB: [birth year]  Follow-Up Visit Note  CC: No primary care provider on file.  No ref. provider found  Diagnosis:   Stage T1b, N0, M0, FIGO grade I endometrial cancer  Interval Since Last Radiation: 3  months   Narrative:  The patient returns today for routine follow-up.  Sabre Eve here in a wheelchair for follow up. She denies pain. She reports that she has a few instances of urinating and then wiping and having a bowel movement instead. She reports having constipation with her last bowel movement two days ago. She reports that the bowel movements are small. She was taking Miralax. Advised her to start taking it again. She denies vaginal bleeding and discharge. She had a pacemaker put in on 2/17. She has lost 17 lbs since January. She reports having a poor appetite and fatigue. She is using a vaginal dilator once or twice a week. Denies blood in stool and diarrhea. Denies blood in urine.                              ALLERGIES:  has No Known Allergies.  Meds: Current Outpatient Prescriptions  Medication Sig Dispense Refill  . amiodarone (PACERONE) 200 MG tablet Take 200 mg by mouth daily.    . Cholecalciferol (VITAMIN D-3 PO) Take by mouth.    Marland Kitchen lisinopril (PRINIVIL,ZESTRIL) 5 MG tablet Take 12.5 mg by mouth daily.     . metoprolol succinate (TOPROL-XL) 25 MG 24 hr tablet Take 50 mg by mouth daily.     Marland Kitchen NIFEdipine (PROCARDIA XL/ADALAT-CC) 60 MG 24 hr tablet Take 60 mg by mouth daily.    . rivaroxaban (XARELTO) 10 MG TABS tablet Take 10 mg by mouth daily.    . rosuvastatin (CRESTOR) 20 MG tablet Take 20 mg by mouth daily.    Marland Kitchen alendronate (FOSAMAX) 70 MG tablet Take 70 mg by mouth once a week. Take with a full glass of water on an empty stomach.     No current facility-administered medications for this encounter.    Physical Findings: The  patient is in no acute distress. Patient is alert and oriented.  height is 5\' 6"  (1.676 m) and weight is 228 lb 11.2 oz (103.738 kg). Her oral temperature is 98.3 F (36.8 C). Her blood pressure is 105/76 and her pulse is 73. Her respiration is 16 and oxygen saturation is 100%. .  No significant changes.  Lungs are clear. Heart rate and rhythm regular since pacemaker placement. No palpable cervical, supraclavicular, or axillary adenopathy. Pacemaker placed high in left chest. On pelvic examination the external genitalia are unremarkable. A speculum exam is performed. There are no mucosal lesions noted in the vaginal vault. A Pap smear was obtained of the proximal vagina. On bimanual and rectovaginal examination there no pelvic masses appreciated. Sphincter tone is noted to be normal.  Lab Findings: Lab Results  Component Value Date   WBC 6.5 09/04/2014   HGB 11.0* 09/04/2014   HCT 34.2* 09/04/2014   MCV 94.2 09/04/2014   PLT 200 09/04/2014    Radiographic Findings: No results found.  Impression:  No evidence of recurrence on clinical exam today, Pap smear pending  Plan: Patient will follow up with Dr. Jerelene Redden in [place] where she lives. I have encouraged her to call if she has any  questions concerning her follow-up.  ____________________________________ Blair Promise, PhD, MD    This document serves as a record of services personally performed by Gery Pray, MD. It was created on his behalf by Jeralene Peters, a trained medical scribe. The creation of this record is based on the scribe's personal observations and the provider's statements to them. This document has been checked and approved by the attending provider.

## 2015-01-09 NOTE — Progress Notes (Signed)
Pap  Specimen collected by MD,sent to lab, patient tolerated well,  d/c via w/c to female family member in waiting room, f/u 3 months with Dr. Jerelene Redden per MD,someone will call her that appt 12:59 PM

## 2015-01-13 LAB — CYTOLOGY - PAP

## 2015-01-15 ENCOUNTER — Telehealth: Payer: Self-pay | Admitting: Oncology

## 2015-01-15 NOTE — Telephone Encounter (Signed)
Called Natasha Schneider and advised her of the good results on her pap smear per Dr. Sondra Come.  Natasha Schneider verbalized agreement and understanding.

## 2017-01-08 IMAGING — CT CT ABD-PELV W/O CM
2 of 4 series · 16 of 46 positions shown, 18 images · non-contrast
Comparison: None.

CLINICAL DATA: Endometrial/uterine cancer diagnosed last month
status post hysterectomy. Constipation. Initial encounter.

EXAM:
CT ABDOMEN AND PELVIS WITHOUT CONTRAST
TECHNIQUE: Multidetector CT imaging of the abdomen and pelvis was performed
following the standard protocol without IV contrast.

[Series 2: rtn a/p w/o · axial · non-contrast · 0.87mm/px · z∈[-396,-12]mm · 13 of 85 slices shown, 15 images]
[im 4/85  soft-tissue]
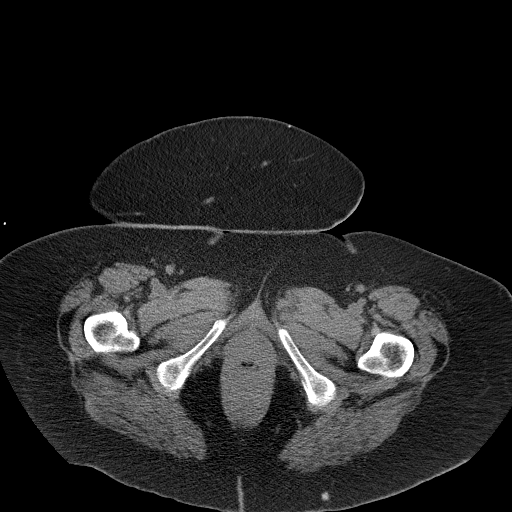
[im 4/85  bone]
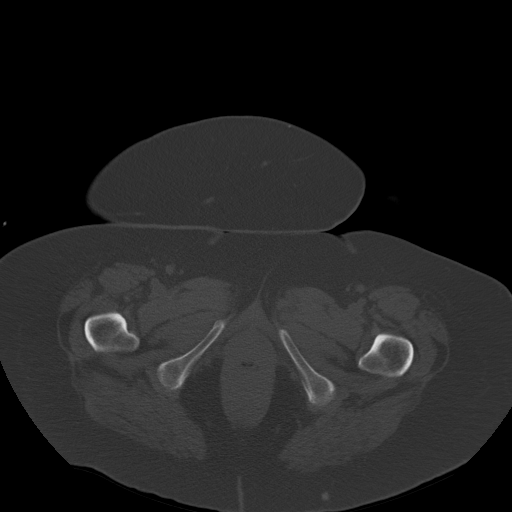
[im 11/85  soft-tissue]
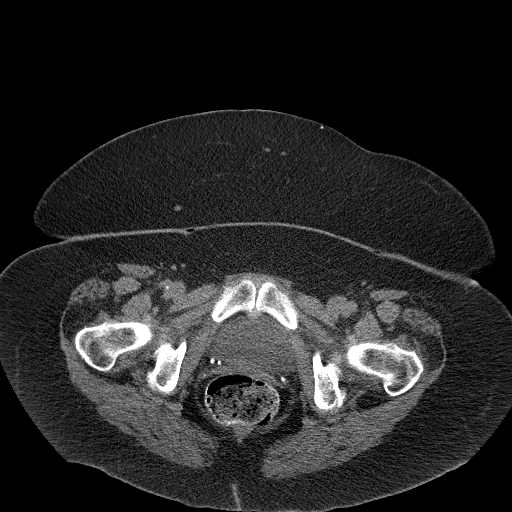
[im 17/85  soft-tissue]
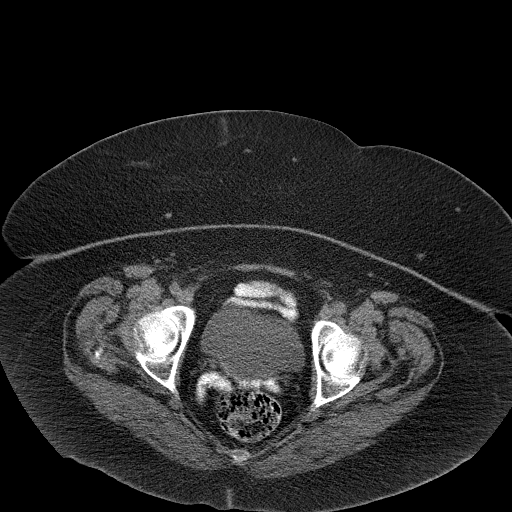
[im 24/85  soft-tissue]
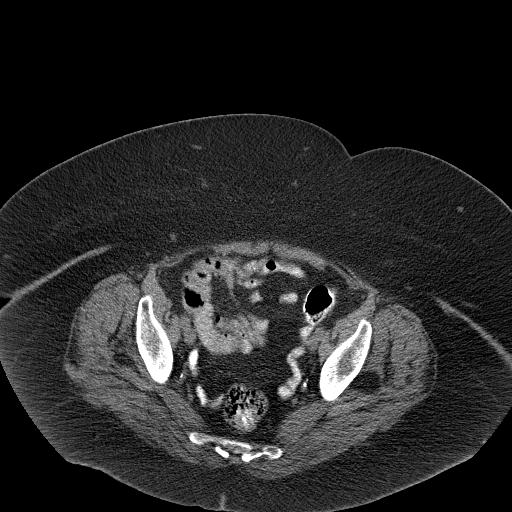
[im 31/85  soft-tissue]
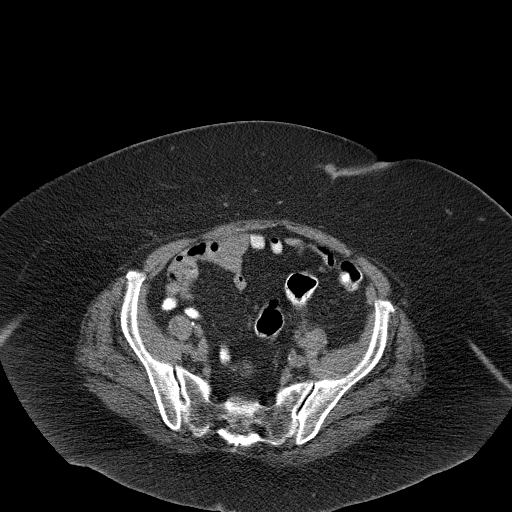
[im 37/85  soft-tissue]
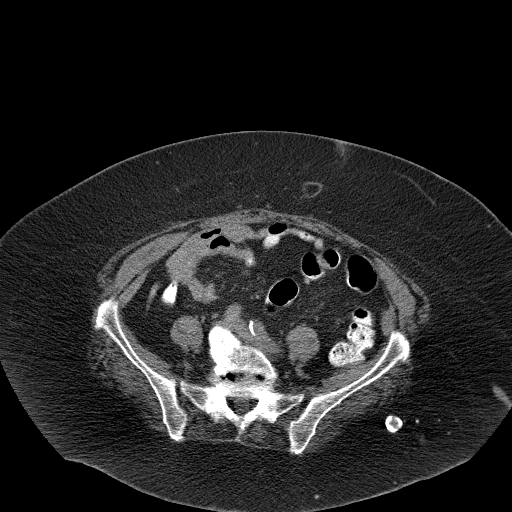
[im 44/85  soft-tissue]
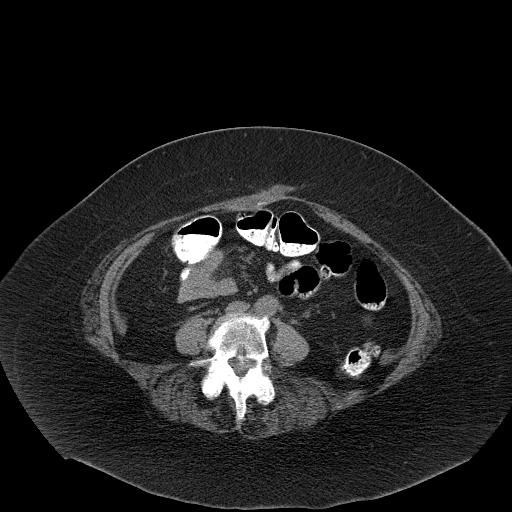
[im 48/85  soft-tissue]
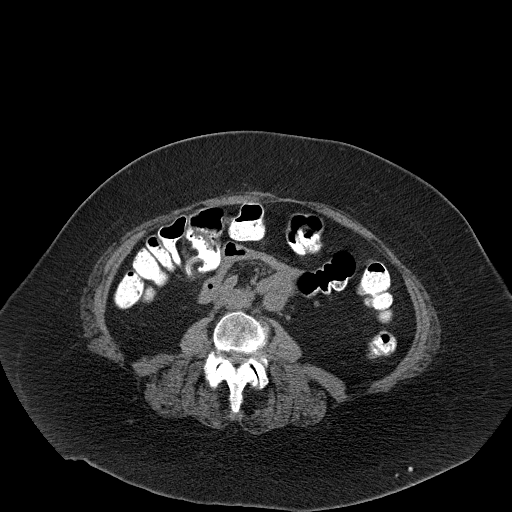
[im 54/85  soft-tissue]
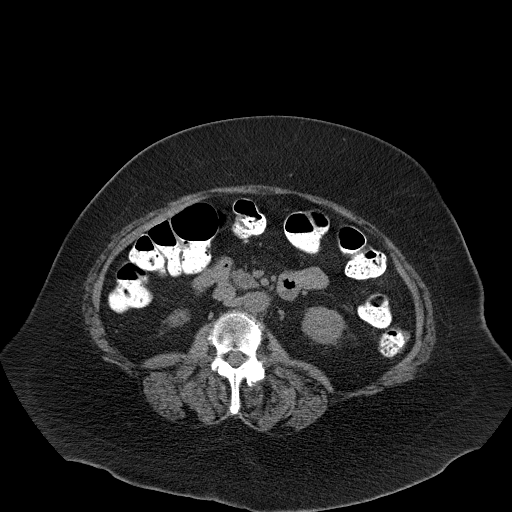
[im 54/85  bone]
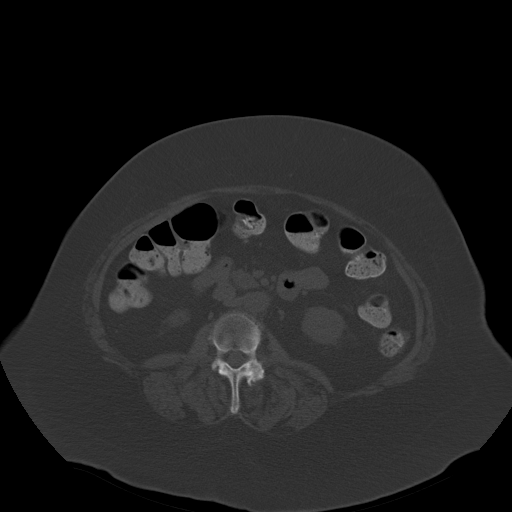
[im 61/85  soft-tissue]
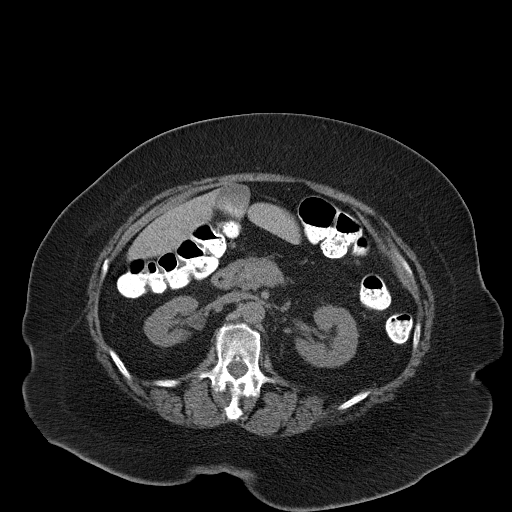
[im 68/85  soft-tissue]
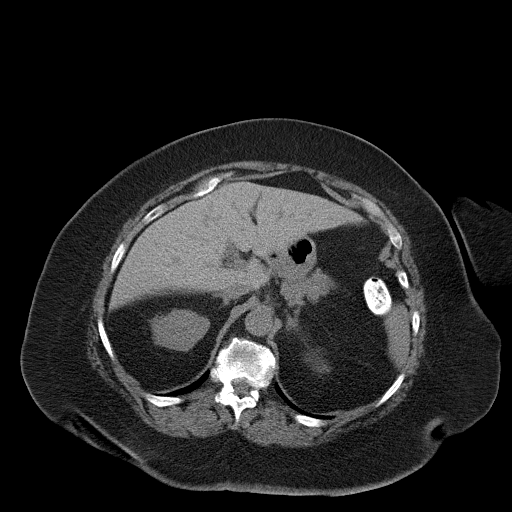
[im 74/85  soft-tissue]
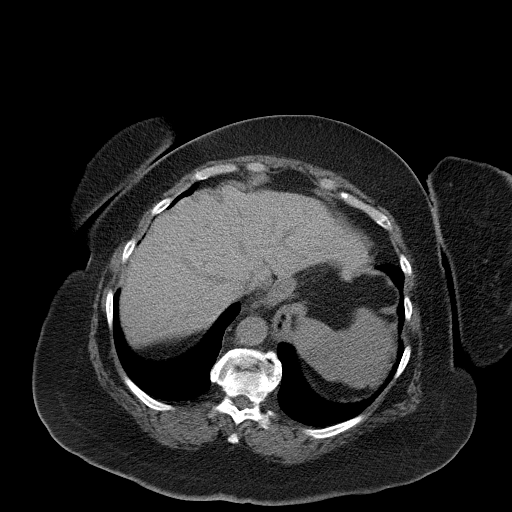
[im 81/85  soft-tissue]
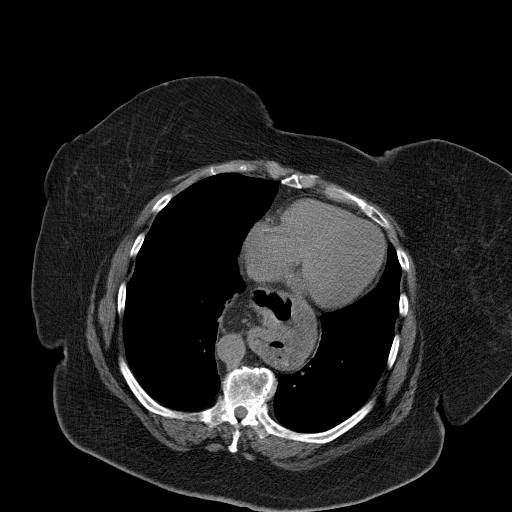

[Series 602: <mpr thick range> · coronal · 0.87mm/px · 3 of 101 slices shown]
[im 34/101  soft-tissue]
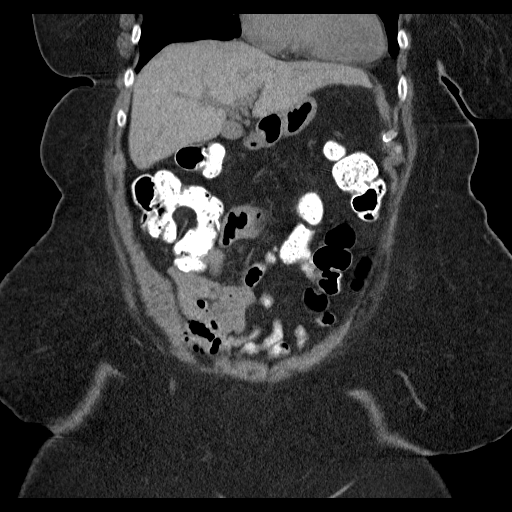
[im 45/101  soft-tissue]
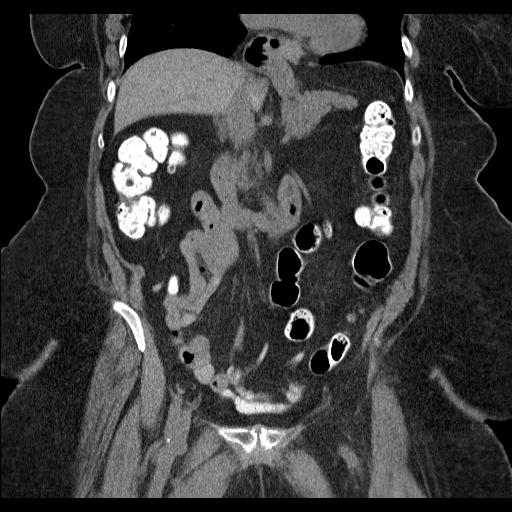
[im 56/101  soft-tissue]
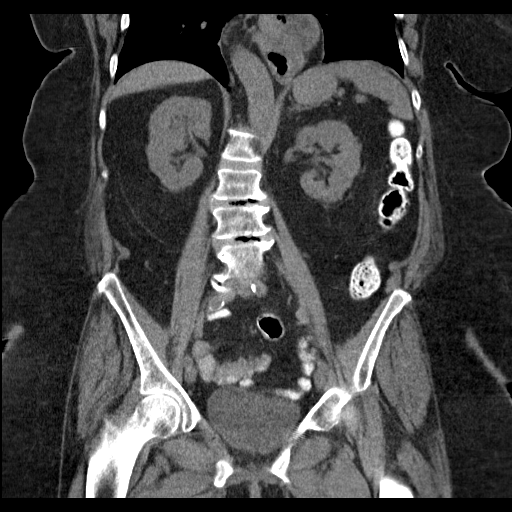

[16 of 46 positions shown; findings below may reference images not displayed]

FINDINGS: Lower chest: Clear lung bases. No significant pleural or pericardial
effusion.There is a moderate size hiatal hernia.

Hepatobiliary: As evaluated in the noncontrast state, the liver
demonstrates no focal abnormality. There is high-density material
within the gallbladder fundus which likely represents small stones.
There is no gallbladder wall thickening or biliary dilatation.

Pancreas: Unremarkable. No pancreatic ductal dilatation or
surrounding inflammatory changes.

Spleen: Normal in size without focal abnormality.

Adrenals/Urinary Tract: Both adrenal glands appear normal.The
kidneys appear normal without evidence of urinary tract calculus or
hydronephrosis. No bladder abnormalities are seen.

Stomach/Bowel: No evidence of bowel wall thickening, distention or
surrounding inflammatory change.No ascites or focal extraluminal
fluid collection.

Vascular/Lymphatic: No retroperitoneal adenopathy demonstrated.
Small pelvic lymph nodes along the iliac vessels are difficult to
exclude on this noncontrast study. These are seen on images 54-56
and could be related to residual ovarian tissue if the hysterectomy
was partial. Mild aortoiliac atherosclerosis and tortuosity noted.

Reproductive: Status post hysterectomy.  No evidence of pelvic mass.

Other: No evidence of abdominal wall mass or hernia.

Musculoskeletal: No acute or significant osseous findings. There are
degenerative changes throughout the spine which are advanced
inferiorly where the patient has multilevel grade 1 anterolisthesis
and disc degeneration.
IMPRESSION: 1. Status post hysterectomy. No definite signs of metastatic disease
on non contrast imaging. Small external iliac lymph nodes
bilaterally are difficult to exclude.
2. The solid visceral organs appear unremarkable as imaged in the
non contrast state.
3. Probable cholelithiasis.
4. Moderate size hiatal hernia.
5. Diffuse lumbar spondylosis.

## 2019-05-16 ENCOUNTER — Inpatient Hospital Stay
Admission: RE | Admit: 2019-05-16 | Discharge: 2019-06-20 | Disposition: A | Discharge status: Skilled Nursing Facility | Payer: Medicare Other | Source: Other Acute Inpatient Hospital | Attending: Internal Medicine | Admitting: Internal Medicine

## 2019-05-16 ENCOUNTER — Other Ambulatory Visit (HOSPITAL_COMMUNITY): Payer: Medicare Other

## 2019-05-16 DIAGNOSIS — J189 Pneumonia, unspecified organism: Secondary | ICD-10-CM

## 2019-05-16 DIAGNOSIS — J9690 Respiratory failure, unspecified, unspecified whether with hypoxia or hypercapnia: Secondary | ICD-10-CM

## 2019-05-16 DIAGNOSIS — Z931 Gastrostomy status: Secondary | ICD-10-CM

## 2019-05-16 MED ORDER — IOHEXOL 300 MG/ML  SOLN
50.0000 mL | Freq: Once | INTRAMUSCULAR | Status: AC | PRN
  Administered 2019-05-16: 50 mL

## 2019-05-17 LAB — CBC
HCT: 24.6 % — ABNORMAL LOW (ref 36.0–46.0)
Hemoglobin: 7.5 g/dL — ABNORMAL LOW (ref 12.0–15.0)
MCH: 30 pg (ref 26.0–34.0)
MCHC: 30.5 g/dL (ref 30.0–36.0)
MCV: 98.4 fL (ref 80.0–100.0)
Platelets: 116 10*3/uL — ABNORMAL LOW (ref 150–400)
RBC: 2.5 MIL/uL — ABNORMAL LOW (ref 3.87–5.11)
RDW: 18.1 % — ABNORMAL HIGH (ref 11.5–15.5)
WBC: 6.5 10*3/uL (ref 4.0–10.5)
nRBC: 0 % (ref 0.0–0.2)

## 2019-05-17 LAB — COMPREHENSIVE METABOLIC PANEL
ALT: 19 U/L (ref 0–44)
AST: 22 U/L (ref 15–41)
Albumin: 1.6 g/dL — ABNORMAL LOW (ref 3.5–5.0)
Alkaline Phosphatase: 86 U/L (ref 38–126)
Anion gap: 6 (ref 5–15)
BUN: 20 mg/dL (ref 8–23)
CO2: 22 mmol/L (ref 22–32)
Calcium: 8.4 mg/dL — ABNORMAL LOW (ref 8.9–10.3)
Chloride: 111 mmol/L (ref 98–111)
Creatinine, Ser: 0.62 mg/dL (ref 0.44–1.00)
GFR calc Af Amer: >60 mL/min (ref >60)
GFR calc non Af Amer: >60 mL/min (ref >60)
Glucose, Bld: 123 mg/dL — ABNORMAL HIGH (ref 70–99)
Potassium: 4.3 mmol/L (ref 3.5–5.1)
Sodium: 139 mmol/L (ref 135–145)
Total Bilirubin: 0.5 mg/dL (ref 0.3–1.2)
Total Protein: 4.1 g/dL — ABNORMAL LOW (ref 6.5–8.1)

## 2019-05-17 LAB — CK: Total CK: 32 U/L — ABNORMAL LOW (ref 38–234)

## 2019-05-17 LAB — TSH: TSH: 0.981 u[IU]/mL (ref 0.350–4.500)

## 2019-05-21 MED ORDER — GENERIC EXTERNAL MEDICATION
5.00 | Status: DC
Start: 2019-05-16 — End: 2019-05-21

## 2019-05-21 MED ORDER — Medication
500.00 | Status: DC
Start: 2019-05-16 — End: 2019-05-21

## 2019-05-21 MED ORDER — ETHYL ALCOHOL (SKIN CLEANSER) 65 % EX FOAM
1300.00 | CUTANEOUS | Status: DC
Start: 2019-05-16 — End: 2019-05-21

## 2019-05-21 MED ORDER — ACETAMINOPHEN 325 MG PO TABS
650.00 | ORAL_TABLET | ORAL | Status: DC
Start: ? — End: 2019-05-21

## 2019-05-21 MED ORDER — CHOLECALCIFEROL 25 MCG (1000 UT) PO TABS
1000.00 | ORAL_TABLET | ORAL | Status: DC
Start: 2019-05-17 — End: 2019-05-21

## 2019-05-21 MED ORDER — SERTRALINE HCL 50 MG PO TABS
50.00 | ORAL_TABLET | ORAL | Status: DC
Start: 2019-05-17 — End: 2019-05-21

## 2019-05-21 MED ORDER — INFANTS TYLENOL COLD/COUGH 15-160-5 MG/1.6ML PO LIQD
250.00 | ORAL | Status: DC
Start: 2019-05-16 — End: 2019-05-21

## 2019-05-21 MED ORDER — ESTROPLUS PO TABS
1.00 | ORAL_TABLET | ORAL | Status: DC
Start: ? — End: 2019-05-21

## 2019-05-21 MED ORDER — DOCUSATE SODIUM 100 MG PO CAPS
100.00 | ORAL_CAPSULE | ORAL | Status: DC
Start: 2019-05-17 — End: 2019-05-21

## 2019-05-21 MED ORDER — GENERIC EXTERNAL MEDICATION
1.00 | Status: DC
Start: 2019-05-16 — End: 2019-05-21

## 2019-05-21 MED ORDER — ATORVASTATIN CALCIUM 40 MG PO TABS
40.00 | ORAL_TABLET | ORAL | Status: DC
Start: 2019-05-17 — End: 2019-05-21

## 2019-05-21 MED ORDER — METOCLOPRAMIDE HCL 10 MG PO TABS
5.00 | ORAL_TABLET | ORAL | Status: DC
Start: 2019-05-16 — End: 2019-05-21

## 2019-05-21 MED ORDER — ZINC SULFATE 220 (50 ZN) MG PO CAPS
220.00 | ORAL_CAPSULE | ORAL | Status: DC
Start: 2019-05-17 — End: 2019-05-21

## 2019-05-21 MED ORDER — Medication
450.00 | Status: DC
Start: 2019-05-17 — End: 2019-05-21

## 2019-05-21 MED ORDER — FERROUS SULFATE 300 (60 FE) MG/5ML PO SYRP
300.00 | ORAL_SOLUTION | ORAL | Status: DC
Start: 2019-05-17 — End: 2019-05-21

## 2019-05-21 MED ORDER — Medication
Status: DC
Start: ? — End: 2019-05-21

## 2019-05-21 MED ORDER — PANTOPRAZOLE 40 MG/20 ML SUSPENSION
40.00 | PACK | ORAL | Status: DC
Start: 2019-05-16 — End: 2019-05-21

## 2019-05-21 MED ORDER — STRI-DEX MAXIMUM STRENGTH 2 % EX PADS
125.00 | MEDICATED_PAD | CUTANEOUS | Status: DC
Start: ? — End: 2019-05-21

## 2019-05-21 MED ORDER — SPINAL NEEDLE (REUSABLE) 22G X 3-1/2" MISC
20.00 | Status: DC
Start: 2019-05-17 — End: 2019-05-21

## 2019-05-21 MED ORDER — AMIODARONE HCL 200 MG PO TABS
200.00 | ORAL_TABLET | ORAL | Status: DC
Start: 2019-05-17 — End: 2019-05-21

## 2019-05-21 MED ORDER — ONDANSETRON HCL 4 MG/2ML IJ SOLN
4.00 | INTRAMUSCULAR | Status: DC
Start: ? — End: 2019-05-21

## 2019-05-21 MED ORDER — WEIGHT LOSS DAILY MULTI PO TABS
5.00 | ORAL_TABLET | ORAL | Status: DC
Start: 2019-05-16 — End: 2019-05-21

## 2019-05-21 MED ORDER — PRIMAQUINE PHOSPHATE POWD
1.00 | Status: DC
Start: ? — End: 2019-05-21

## 2019-05-21 MED ORDER — GLUCOSE 40 % PO GEL
15.00 | ORAL | Status: DC
Start: ? — End: 2019-05-21

## 2019-05-21 MED ORDER — GENERIC EXTERNAL MEDICATION
5.00 | Status: DC
Start: ? — End: 2019-05-21

## 2019-05-21 MED ORDER — PILOCARPINE-EPINEPHRINE 1-1 % OP SOLN
OPHTHALMIC | Status: DC
Start: 2019-05-17 — End: 2019-05-21

## 2019-05-23 LAB — CBC
HCT: 22 % — ABNORMAL LOW (ref 36.0–46.0)
Hemoglobin: 6.8 g/dL — CL (ref 12.0–15.0)
MCH: 30.5 pg (ref 26.0–34.0)
MCHC: 30.9 g/dL (ref 30.0–36.0)
MCV: 98.7 fL (ref 80.0–100.0)
Platelets: 167 10*3/uL (ref 150–400)
RBC: 2.23 MIL/uL — ABNORMAL LOW (ref 3.87–5.11)
RDW: 17.9 % — ABNORMAL HIGH (ref 11.5–15.5)
WBC: 6.4 10*3/uL (ref 4.0–10.5)
nRBC: 0 % (ref 0.0–0.2)

## 2019-05-23 LAB — COMPREHENSIVE METABOLIC PANEL
ALT: 17 U/L (ref 0–44)
AST: 25 U/L (ref 15–41)
Albumin: 1.4 g/dL — ABNORMAL LOW (ref 3.5–5.0)
Alkaline Phosphatase: 96 U/L (ref 38–126)
Anion gap: 4 — ABNORMAL LOW (ref 5–15)
BUN: 27 mg/dL — ABNORMAL HIGH (ref 8–23)
CO2: 27 mmol/L (ref 22–32)
Calcium: 8.1 mg/dL — ABNORMAL LOW (ref 8.9–10.3)
Chloride: 106 mmol/L (ref 98–111)
Creatinine, Ser: 0.53 mg/dL (ref 0.44–1.00)
GFR calc Af Amer: >60 mL/min (ref >60)
GFR calc non Af Amer: >60 mL/min (ref >60)
Glucose, Bld: 124 mg/dL — ABNORMAL HIGH (ref 70–99)
Potassium: 3.9 mmol/L (ref 3.5–5.1)
Sodium: 137 mmol/L (ref 135–145)
Total Bilirubin: 0.5 mg/dL (ref 0.3–1.2)
Total Protein: 4.2 g/dL — ABNORMAL LOW (ref 6.5–8.1)

## 2019-05-23 LAB — CK: Total CK: 50 U/L (ref 38–234)

## 2019-05-23 LAB — PREPARE RBC (CROSSMATCH)

## 2019-05-23 LAB — ABO/RH: ABO/RH(D): O POS

## 2019-05-24 LAB — TYPE AND SCREEN
ABO/RH(D): O POS
Antibody Screen: NEGATIVE
Unit division: 0

## 2019-05-24 LAB — CBC
HCT: 27.8 % — ABNORMAL LOW (ref 36.0–46.0)
Hemoglobin: 9 g/dL — ABNORMAL LOW (ref 12.0–15.0)
MCH: 31.5 pg (ref 26.0–34.0)
MCHC: 32.4 g/dL (ref 30.0–36.0)
MCV: 97.2 fL (ref 80.0–100.0)
Platelets: 193 10*3/uL (ref 150–400)
RBC: 2.86 MIL/uL — ABNORMAL LOW (ref 3.87–5.11)
RDW: 17.2 % — ABNORMAL HIGH (ref 11.5–15.5)
WBC: 6.1 10*3/uL (ref 4.0–10.5)
nRBC: 0 % (ref 0.0–0.2)

## 2019-05-24 LAB — BPAM RBC
Blood Product Expiration Date: 202010252359
ISSUE DATE / TIME: 202009231437
Unit Type and Rh: 5100

## 2019-05-24 NOTE — Consult Note (Signed)
Infectious Disease Consultation   Natasha Schneider  [id number]  DOB: 10-19-1933  DOA: 05/16/2019  Requesting physician: Dr.Brown  Reason for consultation: Antibiotic recommendations   History of Present Illness: Patient is a poor historian.  Therefore history obtained from the medical records. Natasha Schneider is an 85 y.o. female with past medical history of atrial fibrillation, chronic kidney disease, anemia, multiple wounds, malnutrition and history of CVA in the past.  She was initially admitted to [facility] on 05/03/2019 for septic shock, multiple pressure sores, osteomyelitis of the left calcaneus and right fibula.  She was previously residing at [facility].  Patient apparently was losing weight, not eating well and progressively weaker and confined to her bed therefore developed a large necrotic pressure sores.  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  Patient had colostomy placed at that time.  Her postoperative course at that time was complicated by small bowel obstruction and respiratory insufficiency.  She was also treated for sepsis/healthcare associated pneumonia and cellulitis.  Since that time patient reportedly remained bedridden.  During this current hospitalization at the acute hospital patient was admitted to ICU, dialysis catheter was placed for emergent dialysis.  She was hypotensive and was in septic shock on admission.  Blood cultures were sent and she was started on IV vancomycin, Zosyn.  She had multiple pressure ulcers including both calves of her legs, upper back, left elbow, left heel, sacral area.  Culture showed polymicrobial infection.  Infectious disease was consulted.  Vancomycin was discontinued and she was started on cefepime and daptomycin.  Wound cultures showed Proteus, enterococcus, Pseudomonas, Corynebacterium.  Blood cultures were negative.  MRI showed  osteomyelitis in the left calcaneus and right femur.  Amputation was discussed.  However, patient/family declined.  She received enzymatic debridement with Santyl and Aquacel with dressing changes to her wounds.  Due to her complex medical problems she was transferred to [facility] on 05/15/2019.  Patient at this time is somewhat confused.  Review of Systems:  She is confused at this time.  Very poor historian.  Unable to obtain review of systems at this time.  Past Medical History: Past Medical History:  Diagnosis Date  . Arthritis   . Atrial fibrillation ([place])   . Hypercholesteremia   . Hypertension   . Radiation 09/17/14, 09/19/14, 09/24/14, 09/26/14, 10/01/14   proximal vagina 30 gray  . Stroke [facility])   Chronic atrial fibrillation, chronic anemia, hypertension, hyperlipidemia, CVA, bedbound, failure to thrive, ostomy secondary to sigmoid surgery, chronic indwelling Foley catheter, chronic kidney disease, malnutrition, multiple pressure ulcers, GERD.  Past Surgical History:  history of pacemaker  Allergies:  No Known Allergies   Social History: Patient used to reside at [facility] prior to acute hospitalization.  No mention of alcohol, tobacco abuse or recreational drug abuse.   Family History: Family History  Problem Relation Age of Onset  . Colon cancer Father      Physical Exam: Temperature 98.8, pulse 83, respiratory 20, blood pressure 100/45, O2 saturation 100% on oxygen. Constitutional: Ill-appearing female, confused Eyes: PERLA, EOMI, irises appear normal.  HEENT: Atraumatic, normocephalic, mild facial asymmetry, oropharynx appears to be okay Neck: neck appears normal, no masses  CVS: Irregularly irregular, lower extremity edema Respiratory: Decreased breath sounds lower lobes, occasional rhonchi, no wheezing Abdomen: She has ostomy, PEG tube, positive bowel sounds Musculoskeletal: Upper and lower extremity  edema Neuro: Patient confused.  She has severe weakness with  generalized debility.  Not following commands at this time.  Unable to do a complete neurologic exam at this time. Skin: Multiple pressure ulcers  Data reviewed:  I have personally reviewed following labs and imaging studies Labs:  CBC: Recent Labs  Lab 05/23/19 1041 05/24/19 1315  WBC 6.4 6.1  HGB 6.8* 9.0*  HCT 22.0* 27.8*  MCV 98.7 97.2  PLT 167 0000000    Basic Metabolic Panel: Recent Labs  Lab 05/23/19 0710  NA 137  K 3.9  CL 106  CO2 27  GLUCOSE 124*  BUN 27*  CREATININE 0.53  CALCIUM 8.1*   GFR CrCl cannot be calculated (Unknown ideal weight.). Liver Function Tests: Recent Labs  Lab 05/23/19 0710  AST 25  ALT 17  ALKPHOS 96  BILITOT 0.5  PROT 4.2*  ALBUMIN 1.4*   No results for input(s): LIPASE, AMYLASE in the last 168 hours. No results for input(s): AMMONIA in the last 168 hours. Coagulation profile No results for input(s): INR, PROTIME in the last 168 hours.  Cardiac Enzymes: Recent Labs  Lab 05/17/19 1705 05/23/19 0710  CKTOTAL 32* 50   BNP: Invalid input(s): POCBNP CBG: No results for input(s): GLUCAP in the last 168 hours. D-Dimer No results for input(s): DDIMER in the last 72 hours. Hgb A1c No results for input(s): HGBA1C in the last 72 hours. Lipid Profile No results for input(s): CHOL, HDL, LDLCALC, TRIG, CHOLHDL, LDLDIRECT in the last 72 hours. Thyroid function studies No results for input(s): TSH, T4TOTAL, T3FREE, THYROIDAB in the last 72 hours.  Invalid input(s): FREET3 Anemia work up No results for input(s): VITAMINB12, FOLATE, FERRITIN, TIBC, IRON, RETICCTPCT in the last 72 hours. Urinalysis No results found for: COLORURINE, APPEARANCEUR, LABSPEC, [place], GLUCOSEU, HGBUR, BILIRUBINUR, KETONESUR, PROTEINUR, UROBILINOGEN, NITRITE, [place]   Microbiology No results found for this or any previous visit (from the past 240 hour(s)).     Inpatient Medications:   Scheduled Meds: Continuous Infusions:   Radiological  Exams on Admission: No results found.  Impression/Recommendations Osteomyelitis of the right fibula Osteomyelitis of the left foot Failure to thrive Pressure injury of deep tissue of calf Pressure injury of sacral area Chronic kidney disease stage III Chronic atrial fibrillation History of stroke Dysphagia Protein calorie malnutrition  Osteomyelitis of the right fibula/left foot: Patient at the acute hospital had sepsis with shock.  Currently shock is resolved.  She is on treatment with cefepime, daptomycin until 06/20/2019.  Continue local wound care.  Due to her debility, bedbound status she is very high risk for worsening of the wounds with secondary infection as well as sepsis.  Please monitor CK while on the daptomycin.  Also monitor BUN/creatinine closely and adjust antibiotics accordingly.  If she starts having any fevers would recommend to send for pan cultures.  Pressure injury of the deep tissue of calf/pressure injury of the sacral area: Continue local wound care.  As mentioned above she is at very high risk for worsening of the wounds with secondary infection and sepsis.  On antibiotics as mentioned above.  Chronic kidney disease stage III: Please continue to monitor BUN/creatinine closely while on antibiotics and adjust dose accordingly.  Further management of CKD per the primary team.  Chronic atrial fibrillation: Medications and management by the primary team.  History of CVA: Patient reportedly has chronic encephalopathy from the CVA.  She also has dysphagia and is at high risk for aspiration and worsening respiratory failure from aspiration  pneumonia.  Protein calorie malnutrition: On tube feeds.  Management per the primary team.  Due to her multiple complex medical problems she is a very high risk for worsening anticoagulation.  Thank you for this consultation.     Yaakov Guthrie, M.D. 05/24/2019, 3:55 PM

## 2019-05-26 LAB — HEPATITIS B CORE ANTIBODY, IGM: Hep B C IgM: NEGATIVE

## 2019-05-26 LAB — HEPATITIS B SURFACE ANTIGEN: Hepatitis B Surface Ag: NEGATIVE

## 2019-05-26 LAB — HEPATITIS B SURFACE ANTIBODY, QUANTITATIVE: Hep B S AB Quant (Post): 3.7 m[IU]/mL — ABNORMAL LOW (ref >9.9)

## 2019-05-28 ENCOUNTER — Other Ambulatory Visit (HOSPITAL_COMMUNITY): Payer: Medicare Other

## 2019-05-28 LAB — CBC
HCT: 25.9 % — ABNORMAL LOW (ref 36.0–46.0)
Hemoglobin: 8 g/dL — ABNORMAL LOW (ref 12.0–15.0)
MCH: 30.5 pg (ref 26.0–34.0)
MCHC: 30.9 g/dL (ref 30.0–36.0)
MCV: 98.9 fL (ref 80.0–100.0)
Platelets: 294 10*3/uL (ref 150–400)
RBC: 2.62 MIL/uL — ABNORMAL LOW (ref 3.87–5.11)
RDW: 16.2 % — ABNORMAL HIGH (ref 11.5–15.5)
WBC: 3.8 10*3/uL — ABNORMAL LOW (ref 4.0–10.5)
nRBC: 0 % (ref 0.0–0.2)

## 2019-05-28 LAB — BASIC METABOLIC PANEL
Anion gap: 7 (ref 5–15)
BUN: 21 mg/dL (ref 8–23)
CO2: 27 mmol/L (ref 22–32)
Calcium: 8 mg/dL — ABNORMAL LOW (ref 8.9–10.3)
Chloride: 105 mmol/L (ref 98–111)
Creatinine, Ser: 0.42 mg/dL — ABNORMAL LOW (ref 0.44–1.00)
GFR calc Af Amer: >60 mL/min (ref >60)
GFR calc non Af Amer: >60 mL/min (ref >60)
Glucose, Bld: 122 mg/dL — ABNORMAL HIGH (ref 70–99)
Potassium: 3.7 mmol/L (ref 3.5–5.1)
Sodium: 139 mmol/L (ref 135–145)

## 2019-05-28 LAB — CK: Total CK: 71 U/L (ref 38–234)

## 2019-05-29 ENCOUNTER — Other Ambulatory Visit (HOSPITAL_COMMUNITY): Payer: Medicare Other

## 2019-05-30 LAB — CK: Total CK: 67 U/L (ref 38–234)

## 2019-06-02 ENCOUNTER — Other Ambulatory Visit (HOSPITAL_COMMUNITY): Payer: Medicare Other

## 2019-06-02 LAB — URINALYSIS, ROUTINE W REFLEX MICROSCOPIC
Bilirubin Urine: NEGATIVE
Glucose, UA: NEGATIVE mg/dL
Ketones, ur: NEGATIVE mg/dL
Nitrite: NEGATIVE
Protein, ur: 100 mg/dL — AB
RBC / HPF: >50 RBC/hpf — ABNORMAL HIGH (ref 0–5)
Specific Gravity, Urine: 1.014 (ref 1.005–1.030)
WBC, UA: >50 WBC/hpf — ABNORMAL HIGH (ref 0–5)
pH: 8 (ref 5.0–8.0)

## 2019-06-03 LAB — CBC
HCT: 25.4 % — ABNORMAL LOW (ref 36.0–46.0)
Hemoglobin: 7.9 g/dL — ABNORMAL LOW (ref 12.0–15.0)
MCH: 30.9 pg (ref 26.0–34.0)
MCHC: 31.1 g/dL (ref 30.0–36.0)
MCV: 99.2 fL (ref 80.0–100.0)
Platelets: 312 10*3/uL (ref 150–400)
RBC: 2.56 MIL/uL — ABNORMAL LOW (ref 3.87–5.11)
RDW: 15.7 % — ABNORMAL HIGH (ref 11.5–15.5)
WBC: 6.3 10*3/uL (ref 4.0–10.5)
nRBC: 0 % (ref 0.0–0.2)

## 2019-06-03 LAB — BASIC METABOLIC PANEL
Anion gap: 5 (ref 5–15)
BUN: 18 mg/dL (ref 8–23)
CO2: 28 mmol/L (ref 22–32)
Calcium: 7.8 mg/dL — ABNORMAL LOW (ref 8.9–10.3)
Chloride: 105 mmol/L (ref 98–111)
Creatinine, Ser: 0.45 mg/dL (ref 0.44–1.00)
GFR calc Af Amer: >60 mL/min (ref >60)
GFR calc non Af Amer: >60 mL/min (ref >60)
Glucose, Bld: 98 mg/dL (ref 70–99)
Potassium: 3.6 mmol/L (ref 3.5–5.1)
Sodium: 138 mmol/L (ref 135–145)

## 2019-06-04 LAB — URINE CULTURE: Culture: <10000 — AB

## 2019-06-08 LAB — BASIC METABOLIC PANEL
Anion gap: 11 (ref 5–15)
BUN: 19 mg/dL (ref 8–23)
CO2: 24 mmol/L (ref 22–32)
Calcium: 8 mg/dL — ABNORMAL LOW (ref 8.9–10.3)
Chloride: 103 mmol/L (ref 98–111)
Creatinine, Ser: 0.45 mg/dL (ref 0.44–1.00)
GFR calc Af Amer: >60 mL/min (ref >60)
GFR calc non Af Amer: >60 mL/min (ref >60)
Glucose, Bld: 92 mg/dL (ref 70–99)
Potassium: 3.9 mmol/L (ref 3.5–5.1)
Sodium: 138 mmol/L (ref 135–145)

## 2019-06-08 LAB — CBC
HCT: 23.5 % — ABNORMAL LOW (ref 36.0–46.0)
Hemoglobin: 7.4 g/dL — ABNORMAL LOW (ref 12.0–15.0)
MCH: 30.5 pg (ref 26.0–34.0)
MCHC: 31.5 g/dL (ref 30.0–36.0)
MCV: 96.7 fL (ref 80.0–100.0)
Platelets: 265 10*3/uL (ref 150–400)
RBC: 2.43 MIL/uL — ABNORMAL LOW (ref 3.87–5.11)
RDW: 15.5 % (ref 11.5–15.5)
WBC: 6.4 10*3/uL (ref 4.0–10.5)
nRBC: 0 % (ref 0.0–0.2)

## 2019-06-12 LAB — PHOSPHORUS: Phosphorus: 4.2 mg/dL (ref 2.5–4.6)

## 2019-06-15 LAB — CBC
HCT: 26.6 % — ABNORMAL LOW (ref 36.0–46.0)
Hemoglobin: 8.3 g/dL — ABNORMAL LOW (ref 12.0–15.0)
MCH: 30.7 pg (ref 26.0–34.0)
MCHC: 31.2 g/dL (ref 30.0–36.0)
MCV: 98.5 fL (ref 80.0–100.0)
Platelets: 215 10*3/uL (ref 150–400)
RBC: 2.7 MIL/uL — ABNORMAL LOW (ref 3.87–5.11)
RDW: 16.2 % — ABNORMAL HIGH (ref 11.5–15.5)
WBC: 6.9 10*3/uL (ref 4.0–10.5)
nRBC: 0 % (ref 0.0–0.2)

## 2019-06-15 LAB — COMPREHENSIVE METABOLIC PANEL
ALT: 15 U/L (ref 0–44)
AST: 17 U/L (ref 15–41)
Albumin: 1.2 g/dL — ABNORMAL LOW (ref 3.5–5.0)
Alkaline Phosphatase: 75 U/L (ref 38–126)
Anion gap: 7 (ref 5–15)
BUN: 21 mg/dL (ref 8–23)
CO2: 23 mmol/L (ref 22–32)
Calcium: 8.1 mg/dL — ABNORMAL LOW (ref 8.9–10.3)
Chloride: 106 mmol/L (ref 98–111)
Creatinine, Ser: 0.48 mg/dL (ref 0.44–1.00)
GFR calc Af Amer: >60 mL/min (ref >60)
GFR calc non Af Amer: >60 mL/min (ref >60)
Glucose, Bld: 101 mg/dL — ABNORMAL HIGH (ref 70–99)
Potassium: 4.5 mmol/L (ref 3.5–5.1)
Sodium: 136 mmol/L (ref 135–145)
Total Bilirubin: 0.3 mg/dL (ref 0.3–1.2)
Total Protein: 4.1 g/dL — ABNORMAL LOW (ref 6.5–8.1)

## 2019-06-15 LAB — CK: Total CK: 56 U/L (ref 38–234)

## 2019-06-15 LAB — SEDIMENTATION RATE: Sed Rate: 79 mm/hr — ABNORMAL HIGH (ref 0–22)

## 2019-06-19 LAB — SARS CORONAVIRUS 2 (TAT 6-24 HRS): SARS Coronavirus 2: NEGATIVE

## 2019-07-31 DEATH — deceased

## 2021-10-01 IMAGING — DX DG CHEST 1V PORT
1 series · 1 of 1 positions shown · non-contrast
Comparison: 05/17/2019 chest radiograph.

CLINICAL DATA: Pneumonia

EXAM:
PORTABLE CHEST 1 VIEW

[chest ap]
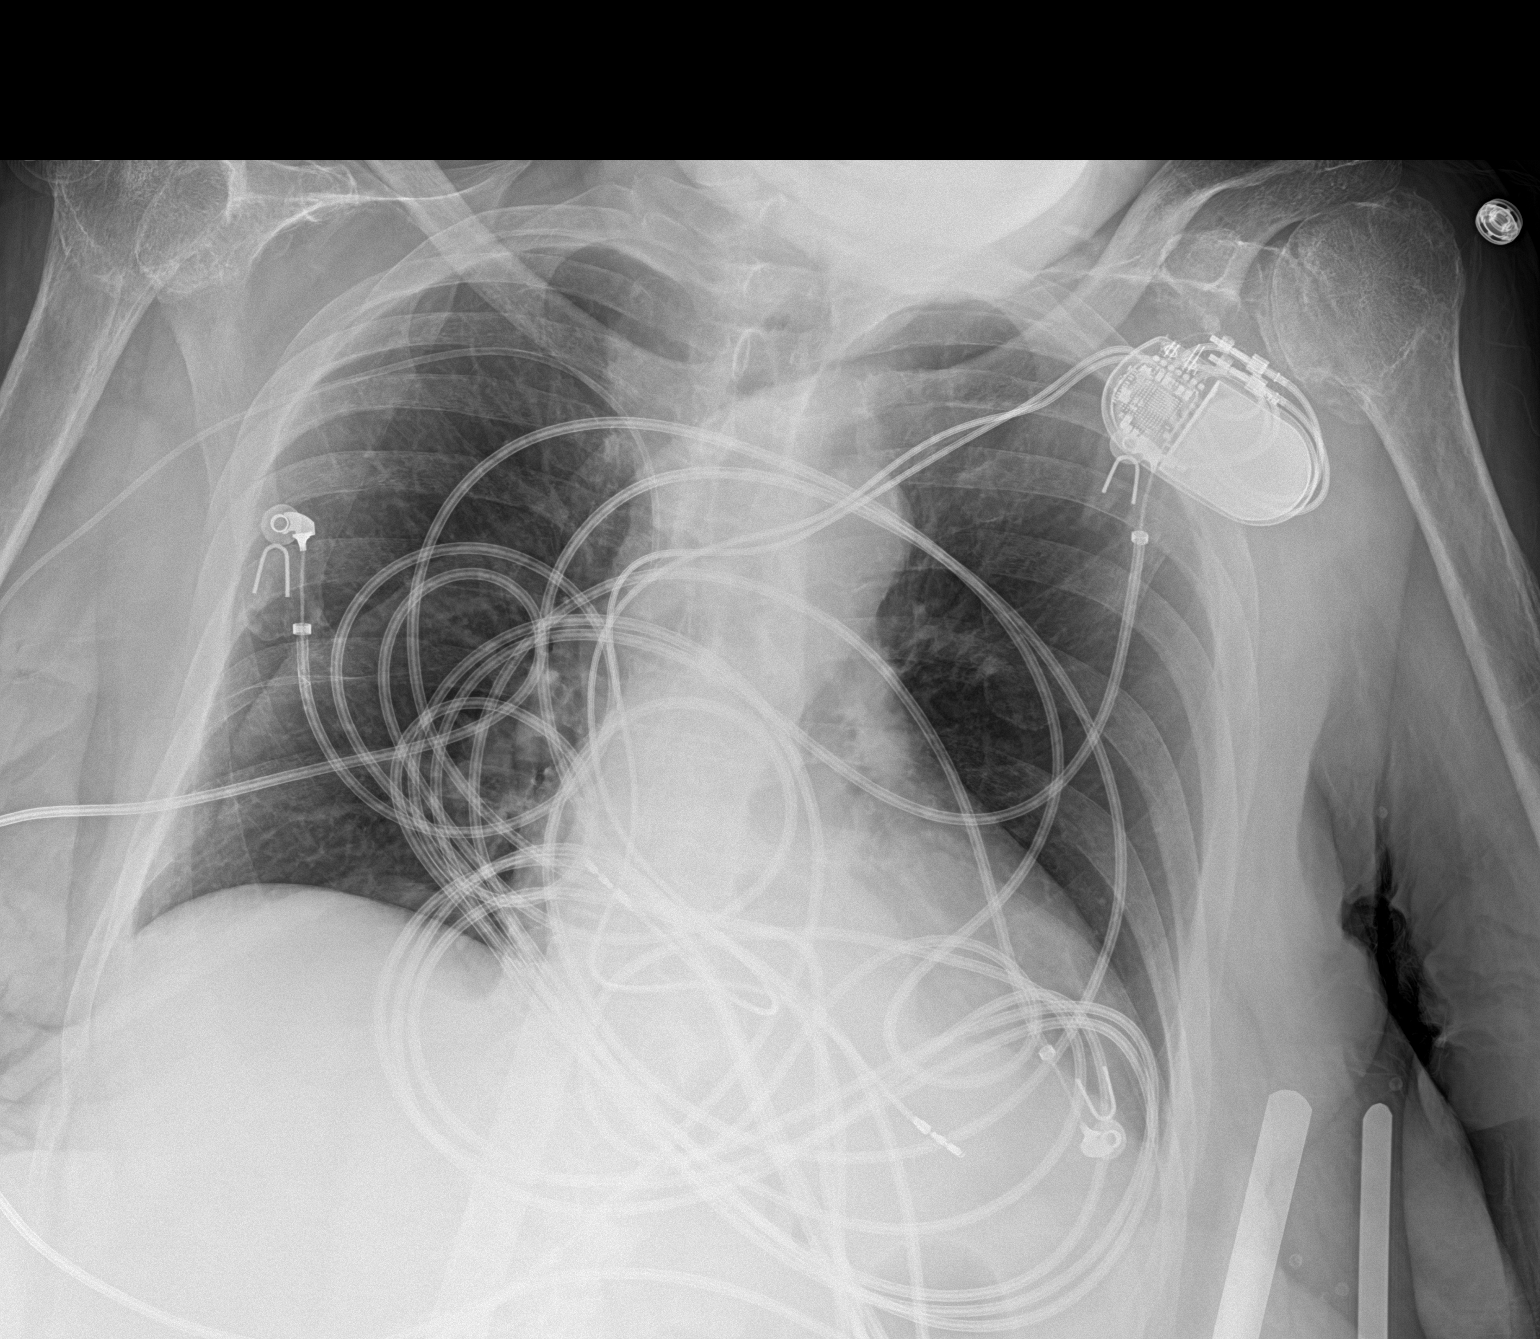

[1 of 1 positions shown; findings below may reference images not displayed]

FINDINGS: Right PICC terminates in the middle third of the SVC. Stable
configuration of 2 lead left subclavian pacemaker. Stable
cardiomediastinal silhouette with top-normal heart size and moderate
hiatal hernia. No pneumothorax. No pleural effusion. Lungs appear
clear, with no acute consolidative airspace disease and no pulmonary
edema.
IMPRESSION: 1. No active cardiopulmonary disease.
2. Chronic moderate hiatal hernia.
